# Patient Record
Sex: Male | Born: 1965 | Race: White | Hispanic: No | Marital: Married | Smoking: Never smoker
Health system: Southern US, Community
[De-identification: ages and names within clinical notes are randomized; demographics above are authoritative.]

## PROBLEM LIST (undated history)

## (undated) DIAGNOSIS — Z86718 Personal history of other venous thrombosis and embolism: Secondary | ICD-10-CM

## (undated) DIAGNOSIS — K219 Gastro-esophageal reflux disease without esophagitis: Secondary | ICD-10-CM

## (undated) DIAGNOSIS — Z87898 Personal history of other specified conditions: Secondary | ICD-10-CM

## (undated) DIAGNOSIS — M545 Low back pain, unspecified: Secondary | ICD-10-CM

## (undated) DIAGNOSIS — Z8781 Personal history of (healed) traumatic fracture: Secondary | ICD-10-CM

## (undated) DIAGNOSIS — K8020 Calculus of gallbladder without cholecystitis without obstruction: Secondary | ICD-10-CM

## (undated) DIAGNOSIS — R001 Bradycardia, unspecified: Secondary | ICD-10-CM

## (undated) DIAGNOSIS — M1990 Unspecified osteoarthritis, unspecified site: Secondary | ICD-10-CM

## (undated) HISTORY — PX: ORCHIOPEXY: SHX479

## (undated) HISTORY — DX: Personal history of other venous thrombosis and embolism: Z86.718

## (undated) HISTORY — DX: Personal history of other specified conditions: Z87.898

## (undated) HISTORY — DX: Low back pain: M54.5

## (undated) HISTORY — DX: Bradycardia, unspecified: R00.1

## (undated) HISTORY — DX: Calculus of gallbladder without cholecystitis without obstruction: K80.20

## (undated) HISTORY — PX: TONSILLECTOMY AND ADENOIDECTOMY: SUR1326

## (undated) HISTORY — DX: Personal history of (healed) traumatic fracture: Z87.81

## (undated) HISTORY — DX: Low back pain, unspecified: M54.50

---

## 2003-10-24 DIAGNOSIS — Z86718 Personal history of other venous thrombosis and embolism: Secondary | ICD-10-CM

## 2003-10-24 DIAGNOSIS — Z8781 Personal history of (healed) traumatic fracture: Secondary | ICD-10-CM

## 2003-10-24 HISTORY — DX: Personal history of (healed) traumatic fracture: Z87.81

## 2003-10-24 HISTORY — DX: Personal history of other venous thrombosis and embolism: Z86.718

## 2013-10-23 LAB — HM COLONOSCOPY

## 2014-03-04 ENCOUNTER — Other Ambulatory Visit: Payer: BC Managed Care – PPO

## 2014-03-04 ENCOUNTER — Other Ambulatory Visit (INDEPENDENT_AMBULATORY_CARE_PROVIDER_SITE_OTHER): Payer: BC Managed Care – PPO

## 2014-03-04 ENCOUNTER — Ambulatory Visit (INDEPENDENT_AMBULATORY_CARE_PROVIDER_SITE_OTHER): Payer: BC Managed Care – PPO | Admitting: Internal Medicine

## 2014-03-04 ENCOUNTER — Encounter: Payer: Self-pay | Admitting: Internal Medicine

## 2014-03-04 VITALS — BP 132/72 | Temp 97.2°F | Ht 74.0 in | Wt 174.0 lb

## 2014-03-04 DIAGNOSIS — R140 Abdominal distension (gaseous): Secondary | ICD-10-CM | POA: Insufficient documentation

## 2014-03-04 DIAGNOSIS — R141 Gas pain: Secondary | ICD-10-CM

## 2014-03-04 DIAGNOSIS — R142 Eructation: Secondary | ICD-10-CM

## 2014-03-04 DIAGNOSIS — R143 Flatulence: Secondary | ICD-10-CM

## 2014-03-04 DIAGNOSIS — M25549 Pain in joints of unspecified hand: Secondary | ICD-10-CM

## 2014-03-04 LAB — CBC WITH DIFFERENTIAL/PLATELET
Basophils Absolute: 0 10*3/uL (ref 0.0–0.1)
Basophils Relative: 0.3 % (ref 0.0–3.0)
Eosinophils Absolute: 0.1 10*3/uL (ref 0.0–0.7)
Eosinophils Relative: 2 % (ref 0.0–5.0)
HCT: 41.1 % (ref 39.0–52.0)
Hemoglobin: 14.1 g/dL (ref 13.0–17.0)
Lymphocytes Relative: 25.1 % (ref 12.0–46.0)
Lymphs Abs: 1.3 10*3/uL (ref 0.7–4.0)
MCHC: 34.2 g/dL (ref 30.0–36.0)
MCV: 94.2 fl (ref 78.0–100.0)
Monocytes Absolute: 0.4 10*3/uL (ref 0.1–1.0)
Monocytes Relative: 7.6 % (ref 3.0–12.0)
NEUTROS PCT: 65 % (ref 43.0–77.0)
Neutro Abs: 3.4 10*3/uL (ref 1.4–7.7)
PLATELETS: 293 10*3/uL (ref 150.0–400.0)
RBC: 4.36 Mil/uL (ref 4.22–5.81)
RDW: 13.5 % (ref 11.5–15.5)
WBC: 5.2 10*3/uL (ref 4.0–10.5)

## 2014-03-04 LAB — BASIC METABOLIC PANEL
BUN: 10 mg/dL (ref 6–23)
CO2: 31 mEq/L (ref 19–32)
Calcium: 9.3 mg/dL (ref 8.4–10.5)
Chloride: 102 mEq/L (ref 96–112)
Creatinine, Ser: 0.8 mg/dL (ref 0.4–1.5)
GFR: 116.23 mL/min (ref >60.00)
Glucose, Bld: 74 mg/dL (ref 70–99)
POTASSIUM: 3.7 meq/L (ref 3.5–5.1)
SODIUM: 139 meq/L (ref 135–145)

## 2014-03-04 LAB — SEDIMENTATION RATE: Sed Rate: 6 mm/hr (ref 0–22)

## 2014-03-04 LAB — HEPATIC FUNCTION PANEL
ALT: 18 U/L (ref 0–53)
AST: 20 U/L (ref 0–37)
Albumin: 4.6 g/dL (ref 3.5–5.2)
Alkaline Phosphatase: 54 U/L (ref 39–117)
BILIRUBIN TOTAL: 0.9 mg/dL (ref 0.2–1.2)
Bilirubin, Direct: 0.1 mg/dL (ref 0.0–0.3)
Total Protein: 6.9 g/dL (ref 6.0–8.3)

## 2014-03-04 LAB — VITAMIN B12: VITAMIN B 12: 136 pg/mL — AB (ref 211–911)

## 2014-03-04 LAB — TSH: TSH: 1.65 u[IU]/mL (ref 0.35–4.50)

## 2014-03-04 MED ORDER — METRONIDAZOLE 500 MG PO TABS: 500.0000 mg | ORAL_TABLET | Freq: Three times a day (TID) | ORAL | Status: DC

## 2014-03-04 MED ORDER — SACCHAROMYCES BOULARDII 250 MG PO CAPS: 250.0000 mg | ORAL_CAPSULE | Freq: Two times a day (BID) | ORAL | Status: DC

## 2014-03-04 NOTE — Patient Instructions (Signed)
Gluten free trial (no wheat products) for 4-6 weeks. OK to use gluten-free bread and gluten-free pasta.  Milk free trial (no milk, ice cream, cheese and yogurt) for 4-6 weeks. OK to use almond, coconut, rice milk. "Almond breeze" brand tastes good.  

## 2014-03-04 NOTE — Progress Notes (Signed)
   Subjective:    HPI  New pt  C/o abd gas and bloating since Christmas 2014. Joshua Walton had a GI w/up in [place]. His H pylori was (-). No n/v. No wt loss, diarrhea. He had a colonoscopy  Had a constipation problem prior He has a remote h/o bradycardia/syncope - none in the past few years  C/o hands OA       Review of Systems  Constitutional: Negative for appetite change, fatigue and unexpected weight change.  HENT: Negative for congestion, dental problem, ear pain, nosebleeds, sneezing, sore throat and trouble swallowing.   Eyes: Negative for itching and visual disturbance.  Respiratory: Negative for cough.   Cardiovascular: Negative for chest pain, palpitations and leg swelling.  Gastrointestinal: Positive for constipation. Negative for nausea, vomiting, abdominal pain, diarrhea, blood in stool, abdominal distention and rectal pain.  Genitourinary: Negative for frequency and hematuria.  Musculoskeletal: Negative for back pain, gait problem, joint swelling and neck pain.  Skin: Negative for rash.  Neurological: Negative for dizziness, tremors, speech difficulty and weakness.  Psychiatric/Behavioral: Negative for suicidal ideas, sleep disturbance, dysphoric mood, decreased concentration and agitation. The patient is not nervous/anxious.        Objective:   Physical Exam  Constitutional: He is oriented to person, place, and time. He appears well-developed. No distress.  NAD  HENT:  Mouth/Throat: Oropharynx is clear and moist.  Eyes: Conjunctivae are normal. Pupils are equal, round, and reactive to light.  Neck: Normal range of motion. No JVD present. No thyromegaly present.  Cardiovascular: Normal rate, regular rhythm, normal heart sounds and intact distal pulses.  Exam reveals no gallop and no friction rub.   No murmur heard. Pulmonary/Chest: Effort normal and breath sounds normal. No respiratory distress. He has no wheezes. He has no rales. He exhibits no tenderness.    Abdominal: Soft. Bowel sounds are normal. He exhibits no distension and no mass. There is no tenderness. There is no rebound and no guarding.  Musculoskeletal: Normal range of motion. He exhibits no edema and no tenderness.  Lymphadenopathy:    He has no cervical adenopathy.  Neurological: He is alert and oriented to person, place, and time. He has normal reflexes. No cranial nerve deficit. He exhibits normal muscle tone. He displays a negative Romberg sign. Coordination and gait normal.  No meningeal signs  Skin: Skin is warm and dry. No rash noted.  Psychiatric: He has a normal mood and affect. His behavior is normal. Judgment and thought content normal.     .     Assessment & Plan:

## 2014-03-04 NOTE — Progress Notes (Signed)
Pre visit review using our clinic review tool, if applicable. No additional management support is needed unless otherwise documented below in the visit note. 

## 2014-03-05 LAB — ALLERGEN FOOD PROFILE SPECIFIC IGE
Apple: <0.1 kU/L
Chicken IgE: <0.1 kU/L
Corn: <0.1 kU/L
IGE (IMMUNOGLOBULIN E), SERUM: 14.1 [IU]/mL (ref 0.0–180.0)
Milk IgE: <0.1 kU/L
Orange: <0.1 kU/L
Peanut IgE: <0.1 kU/L
Soybean IgE: <0.1 kU/L
Tuna IgE: <0.1 kU/L

## 2014-03-06 ENCOUNTER — Telehealth: Payer: Self-pay | Admitting: Internal Medicine

## 2014-03-06 NOTE — Telephone Encounter (Signed)
LVMM for pt call us back and make an OV appt for next week to start the B12 injections.

## 2014-03-06 NOTE — Telephone Encounter (Signed)
Message copied by Lowella Dandy on Fri Mar 06, 2014 12:03 PM ------      Message from: Cassandria Anger      Created: Fri Mar 06, 2014  7:23 AM       Erline Levine, please, inform patient that all labs are normal except for a low vit B12: vit B12 deficiency. Pls see me for an OV next week to start B12 shots      Thx       ------

## 2014-03-07 DIAGNOSIS — M25549 Pain in joints of unspecified hand: Secondary | ICD-10-CM | POA: Insufficient documentation

## 2014-03-07 MED ORDER — VITAMIN D 1000 UNITS PO TABS: 1000.0000 [IU] | ORAL_TABLET | Freq: Every day | ORAL | Status: AC

## 2014-03-07 NOTE — Assessment & Plan Note (Signed)
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 OK to use gluten-free bread and gluten-free pasta. Milk free trial (no milk, ice cream, cheese and yogurt) for 4-6 weeks. OK to use almond, coconut, rice or soy milk. "Almond breeze" brand tastes good. Labs Empiric Flagyl followed by a probiotic x 1 mo

## 2014-03-07 NOTE — Assessment & Plan Note (Signed)
NSAIDs prn

## 2014-03-09 LAB — IGG FOOD PANEL
ALLERGEN BEEF IGG: 4.7 ug/mL — AB (ref <2.0)
Allergen, Milk, IgG: 12.4 ug/mL — ABNORMAL HIGH (ref <0.15)
Chicken, IgG: <0.15 ug/mL (ref <0.15)
Corn, IgG: <0.15 ug/mL (ref <0.15)
Egg white, IgG: <2 ug/mL (ref <2.0)
Peanut, IgG: <0.15 ug/mL (ref <0.15)

## 2014-03-11 ENCOUNTER — Ambulatory Visit (INDEPENDENT_AMBULATORY_CARE_PROVIDER_SITE_OTHER): Payer: BC Managed Care – PPO | Admitting: Internal Medicine

## 2014-03-11 ENCOUNTER — Encounter: Payer: Self-pay | Admitting: Internal Medicine

## 2014-03-11 VITALS — BP 118/78 | HR 60 | Temp 97.3°F | Resp 16 | Wt 173.0 lb

## 2014-03-11 DIAGNOSIS — R141 Gas pain: Secondary | ICD-10-CM

## 2014-03-11 DIAGNOSIS — R142 Eructation: Secondary | ICD-10-CM

## 2014-03-11 DIAGNOSIS — E538 Deficiency of other specified B group vitamins: Secondary | ICD-10-CM

## 2014-03-11 DIAGNOSIS — R140 Abdominal distension (gaseous): Secondary | ICD-10-CM

## 2014-03-11 DIAGNOSIS — R143 Flatulence: Secondary | ICD-10-CM

## 2014-03-11 MED ORDER — VITAMIN B-12 1000 MCG SL SUBL: 1.0000 | SUBLINGUAL_TABLET | Freq: Every day | SUBLINGUAL | Status: AC

## 2014-03-11 MED ORDER — CYANOCOBALAMIN 1000 MCG/ML IJ SOLN
1000.0000 ug | Freq: Once | INTRAMUSCULAR | Status: AC
  Administered 2014-03-11: 1000 ug via INTRAMUSCULAR

## 2014-03-11 NOTE — Progress Notes (Signed)
   Subjective:    HPI    C/o abd gas and bloating since Christmas 2014. Joshua Walton had a GI w/up in [place]. His H pylori was (-). No n/v. No wt loss, diarrhea. He had a colonoscopy 3 mo ago  Had a constipation problem prior He has a remote h/o bradycardia/syncope - none in the past few years  F/u hands OA       Review of Systems  Constitutional: Negative for appetite change, fatigue and unexpected weight change.  HENT: Negative for congestion, dental problem, ear pain, nosebleeds, sneezing, sore throat and trouble swallowing.   Eyes: Negative for itching and visual disturbance.  Respiratory: Negative for cough.   Cardiovascular: Negative for chest pain, palpitations and leg swelling.  Gastrointestinal: Positive for constipation. Negative for nausea, vomiting, abdominal pain, diarrhea, blood in stool, abdominal distention and rectal pain.  Genitourinary: Negative for frequency and hematuria.  Musculoskeletal: Negative for back pain, gait problem, joint swelling and neck pain.  Skin: Negative for rash.  Neurological: Negative for dizziness, tremors, speech difficulty and weakness.  Psychiatric/Behavioral: Negative for suicidal ideas, sleep disturbance, dysphoric mood, decreased concentration and agitation. The patient is not nervous/anxious.        Objective:   Physical Exam  Constitutional: He is oriented to person, place, and time. He appears well-developed. No distress.  NAD  HENT:  Mouth/Throat: Oropharynx is clear and moist.  Eyes: Conjunctivae are normal. Pupils are equal, round, and reactive to light.  Neck: Normal range of motion. No JVD present. No thyromegaly present.  Cardiovascular: Normal rate, regular rhythm, normal heart sounds and intact distal pulses.  Exam reveals no gallop and no friction rub.   No murmur heard. Pulmonary/Chest: Effort normal and breath sounds normal. No respiratory distress. He has no wheezes. He has no rales. He exhibits no  tenderness.  Abdominal: Soft. Bowel sounds are normal. He exhibits no distension and no mass. There is no tenderness. There is no rebound and no guarding.  Musculoskeletal: Normal range of motion. He exhibits no edema and no tenderness.  Lymphadenopathy:    He has no cervical adenopathy.  Neurological: He is alert and oriented to person, place, and time. He has normal reflexes. No cranial nerve deficit. He exhibits normal muscle tone. He displays a negative Romberg sign. Coordination and gait normal.  No meningeal signs  Skin: Skin is warm and dry. No rash noted.  Psychiatric: He has a normal mood and affect. His behavior is normal. Judgment and thought content normal.    Lab Results  Component Value Date   WBC 5.2 03/04/2014   HGB 14.1 03/04/2014   HCT 41.1 03/04/2014   PLT 293.0 03/04/2014   GLUCOSE 74 03/04/2014   ALT 18 03/04/2014   AST 20 03/04/2014   NA 139 03/04/2014   K 3.7 03/04/2014   CL 102 03/04/2014   CREATININE 0.8 03/04/2014   BUN 10 03/04/2014   CO2 31 03/04/2014   TSH 1.65 03/04/2014    .     Assessment & Plan:

## 2014-03-11 NOTE — Progress Notes (Signed)
Pre visit review using our clinic review tool, if applicable. No additional management support is needed unless otherwise documented below in the visit note. 

## 2014-03-16 DIAGNOSIS — E538 Deficiency of other specified B group vitamins: Secondary | ICD-10-CM | POA: Insufficient documentation

## 2014-03-16 NOTE — Assessment & Plan Note (Addendum)
GI consult was suggested to r/o gastritis etc Hold off Flagyl x 1-2 mo

## 2014-03-16 NOTE — Assessment & Plan Note (Signed)
IM B12 inj given B12 def and available ways to treat it were discussed: he would like to take SL B12 Labs in 4-6 wks

## 2014-05-14 ENCOUNTER — Encounter: Payer: Self-pay | Admitting: Internal Medicine

## 2014-07-07 ENCOUNTER — Ambulatory Visit (INDEPENDENT_AMBULATORY_CARE_PROVIDER_SITE_OTHER): Payer: BC Managed Care – PPO | Admitting: Internal Medicine

## 2014-07-07 ENCOUNTER — Other Ambulatory Visit (INDEPENDENT_AMBULATORY_CARE_PROVIDER_SITE_OTHER): Payer: BC Managed Care – PPO

## 2014-07-07 ENCOUNTER — Encounter: Payer: Self-pay | Admitting: Internal Medicine

## 2014-07-07 VITALS — BP 140/88 | HR 64 | Temp 97.7°F | Resp 16 | Wt 171.0 lb

## 2014-07-07 DIAGNOSIS — R143 Flatulence: Secondary | ICD-10-CM

## 2014-07-07 DIAGNOSIS — R141 Gas pain: Secondary | ICD-10-CM

## 2014-07-07 DIAGNOSIS — R140 Abdominal distension (gaseous): Secondary | ICD-10-CM

## 2014-07-07 DIAGNOSIS — Z23 Encounter for immunization: Secondary | ICD-10-CM

## 2014-07-07 DIAGNOSIS — E538 Deficiency of other specified B group vitamins: Secondary | ICD-10-CM

## 2014-07-07 DIAGNOSIS — R142 Eructation: Secondary | ICD-10-CM

## 2014-07-07 DIAGNOSIS — K8020 Calculus of gallbladder without cholecystitis without obstruction: Secondary | ICD-10-CM

## 2014-07-07 LAB — VITAMIN B12: Vitamin B-12: 559 pg/mL (ref 211–911)

## 2014-07-07 LAB — HEPATIC FUNCTION PANEL
ALT: 16 U/L (ref 0–53)
AST: 23 U/L (ref 0–37)
Albumin: 4.2 g/dL (ref 3.5–5.2)
Alkaline Phosphatase: 54 U/L (ref 39–117)
BILIRUBIN DIRECT: 0.1 mg/dL (ref 0.0–0.3)
BILIRUBIN TOTAL: 1 mg/dL (ref 0.2–1.2)
Total Protein: 6.8 g/dL (ref 6.0–8.3)

## 2014-07-07 NOTE — Progress Notes (Signed)
Pre visit review using our clinic review tool, if applicable. No additional management support is needed unless otherwise documented below in the visit note. 

## 2014-07-07 NOTE — Patient Instructions (Signed)
Try Zenpep 1 with meals

## 2014-07-07 NOTE — Assessment & Plan Note (Signed)
Repeat US GI ref was offered

## 2014-07-07 NOTE — Assessment & Plan Note (Signed)
Continue with current prescription therapy as reflected on the Med list. Labs  GI ref was offered - needs EGD

## 2014-07-07 NOTE — Progress Notes (Signed)
   Subjective:    HPI    F/u abd gas and bloating since Christmas 2014. Joshua Walton had a GI w/up in [place]. His H pylori was (-). No n/v. No wt loss, diarrhea. He had a colonoscopy in 2014 - nl. Joshua Walton tells me now that he had gallstones diagnosed 10 years ago (no surgery)  He had a constipation problem prior He has a remote h/o bradycardia/syncope - none in the past few years  F/u hands OA  Wt Readings from Last 3 Encounters:  07/07/14 171 lb (77.565 kg)  03/11/14 173 lb (78.472 kg)  03/04/14 174 lb (78.926 kg)    BP Readings from Last 3 Encounters:  07/07/14 140/88  03/11/14 118/78  03/04/14 132/72         Review of Systems  Constitutional: Negative for appetite change, fatigue and unexpected weight change.  HENT: Negative for congestion, dental problem, ear pain, nosebleeds, sneezing, sore throat and trouble swallowing.   Eyes: Negative for itching and visual disturbance.  Respiratory: Negative for cough.   Cardiovascular: Negative for chest pain, palpitations and leg swelling.  Gastrointestinal: Positive for constipation. Negative for nausea, vomiting, abdominal pain, diarrhea, blood in stool, abdominal distention and rectal pain.  Genitourinary: Negative for frequency and hematuria.  Musculoskeletal: Negative for back pain, gait problem, joint swelling and neck pain.  Skin: Negative for rash.  Neurological: Negative for dizziness, tremors, speech difficulty and weakness.  Psychiatric/Behavioral: Negative for suicidal ideas, sleep disturbance, dysphoric mood, decreased concentration and agitation. The patient is not nervous/anxious.        Objective:   Physical Exam  Constitutional: He is oriented to person, place, and time. He appears well-developed. No distress.  NAD  HENT:  Mouth/Throat: Oropharynx is clear and moist.  Eyes: Conjunctivae are normal. Pupils are equal, round, and reactive to light.  Neck: Normal range of motion. No JVD present. No  thyromegaly present.  Cardiovascular: Normal rate, regular rhythm, normal heart sounds and intact distal pulses.  Exam reveals no gallop and no friction rub.   No murmur heard. Pulmonary/Chest: Effort normal and breath sounds normal. No respiratory distress. He has no wheezes. He has no rales. He exhibits no tenderness.  Abdominal: Soft. Bowel sounds are normal. He exhibits no distension and no mass. There is no tenderness. There is no rebound and no guarding.  Musculoskeletal: Normal range of motion. He exhibits no edema and no tenderness.  Lymphadenopathy:    He has no cervical adenopathy.  Neurological: He is alert and oriented to person, place, and time. He has normal reflexes. No cranial nerve deficit. He exhibits normal muscle tone. He displays a negative Romberg sign. Coordination and gait normal.  No meningeal signs  Skin: Skin is warm and dry. No rash noted.  Psychiatric: He has a normal mood and affect. His behavior is normal. Judgment and thought content normal.     Lab Results  Component Value Date   WBC 5.2 03/04/2014   HGB 14.1 03/04/2014   HCT 41.1 03/04/2014   PLT 293.0 03/04/2014   GLUCOSE 74 03/04/2014   ALT 18 03/04/2014   AST 20 03/04/2014   NA 139 03/04/2014   K 3.7 03/04/2014   CL 102 03/04/2014   CREATININE 0.8 03/04/2014   BUN 10 03/04/2014   CO2 31 03/04/2014   TSH 1.65 03/04/2014        Assessment & Plan:

## 2014-07-09 ENCOUNTER — Encounter: Payer: Self-pay | Admitting: Gastroenterology

## 2014-07-17 ENCOUNTER — Ambulatory Visit
Admission: RE | Admit: 2014-07-17 | Discharge: 2014-07-17 | Disposition: A | Discharge status: Home / Self Care | Payer: BC Managed Care – PPO | Source: Ambulatory Visit | Attending: Internal Medicine | Admitting: Internal Medicine

## 2014-07-19 ENCOUNTER — Other Ambulatory Visit: Payer: Self-pay | Admitting: Internal Medicine

## 2014-07-19 DIAGNOSIS — K8020 Calculus of gallbladder without cholecystitis without obstruction: Secondary | ICD-10-CM

## 2014-08-14 ENCOUNTER — Encounter (HOSPITAL_COMMUNITY): Payer: Self-pay | Admitting: Pharmacy Technician

## 2014-08-21 ENCOUNTER — Other Ambulatory Visit (INDEPENDENT_AMBULATORY_CARE_PROVIDER_SITE_OTHER): Payer: Self-pay | Admitting: General Surgery

## 2014-08-24 NOTE — Pre-Procedure Instructions (Addendum)
Joshua Walton  08/24/2014   Your procedure is scheduled on:  Wednesday, November 11.  Report to [facility] Admitting at 8:10 AM.  Call this number if you have problems the morning of surgery: [phone]   Remember:   Do not eat food or drink liquids after midnight Tuesday, November 10.   Take these medicines the morning of surgery with A SIP OF WATER: None.              Stop taking Vitamins.STOP all herbel meds, nsaids (aleve,naproxen,advil,ibuprofen) 5 days prior to surgery starting 08/28/14   Do not wear jewelry, make-up or nail polish.  Do not wear lotions, powders, or perfumes.   Men may shave face and neck.  Do not bring valuables to the hospital.              [facility] is not responsible for any belongings or valuables.               Contacts, dentures or bridgework may not be worn into surgery.  Leave suitcase in the car. After surgery it may be brought to your room.  For patients admitted to the hospital, discharge time is determined by your treatment team.               Patients discharged the day of surgery will not be allowed to drive home.  Name and phone number of your driver: -   Special Instructions: - Special Instructions: [facility] - Preparing for Surgery  Before surgery, you can play an important role.  Because skin is not sterile, your skin needs to be as free of germs as possible.  You can reduce the number of germs on you skin by washing with CHG (chlorahexidine gluconate) soap before surgery.  CHG is an antiseptic cleaner which kills germs and bonds with the skin to continue killing germs even after washing.  Please DO NOT use if you have an allergy to CHG or antibacterial soaps.  If your skin becomes reddened/irritated stop using the CHG and inform your nurse when you arrive at Short Stay.  Do not shave (including legs and underarms) for at least 48 hours prior to the first CHG shower.  You may shave your face.  Please follow these  instructions carefully:   1.  Shower with CHG Soap the night before surgery and the morning of Surgery.  2.  If you choose to wash your hair, wash your hair first as usual with your normal shampoo.  3.  After you shampoo, rinse your hair and body thoroughly to remove the Shampoo.  4.  Use CHG as you would any other liquid soap.  You can apply chg directly  to the skin and wash gently with scrungie or a clean washcloth.  5.  Apply the CHG Soap to your body ONLY FROM THE NECK DOWN.  Do not use on open wounds or open sores.  Avoid contact with your eyes ears, mouth and genitals (private parts).  Wash genitals (private parts)       with your normal soap.  6.  Wash thoroughly, paying special attention to the area where your surgery will be performed.  7.  Thoroughly rinse your body with warm water from the neck down.  8.  DO NOT shower/wash with your normal soap after using and rinsing off the CHG Soap.  9.  Pat yourself dry with a clean towel.            10.  Wear clean pajamas.            11.  Place clean sheets on your bed the night of your first shower and do not sleep with pets.  Day of Surgery  Do not apply any lotions/deodorants the morning of surgery.  Please wear clean clothes to the hospital/surgery center.   Please read over the following fact sheets that you were given: Pain Booklet, Coughing and Deep Breathing and Surgical Site Infection Prevention

## 2014-08-25 ENCOUNTER — Encounter (HOSPITAL_COMMUNITY)
Admission: RE | Admit: 2014-08-25 | Discharge: 2014-08-25 | Disposition: A | Discharge status: Home / Self Care | Payer: BC Managed Care – PPO | Source: Ambulatory Visit | Attending: General Surgery | Admitting: General Surgery

## 2014-08-25 ENCOUNTER — Encounter (HOSPITAL_COMMUNITY): Payer: Self-pay

## 2014-08-25 DIAGNOSIS — Z01818 Encounter for other preprocedural examination: Secondary | ICD-10-CM | POA: Diagnosis not present

## 2014-08-25 DIAGNOSIS — Z86718 Personal history of other venous thrombosis and embolism: Secondary | ICD-10-CM | POA: Insufficient documentation

## 2014-08-25 DIAGNOSIS — M1990 Unspecified osteoarthritis, unspecified site: Secondary | ICD-10-CM | POA: Diagnosis not present

## 2014-08-25 DIAGNOSIS — K219 Gastro-esophageal reflux disease without esophagitis: Secondary | ICD-10-CM | POA: Insufficient documentation

## 2014-08-25 DIAGNOSIS — R001 Bradycardia, unspecified: Secondary | ICD-10-CM | POA: Diagnosis not present

## 2014-08-25 DIAGNOSIS — R55 Syncope and collapse: Secondary | ICD-10-CM | POA: Insufficient documentation

## 2014-08-25 DIAGNOSIS — I517 Cardiomegaly: Secondary | ICD-10-CM | POA: Insufficient documentation

## 2014-08-25 DIAGNOSIS — Z8781 Personal history of (healed) traumatic fracture: Secondary | ICD-10-CM | POA: Diagnosis not present

## 2014-08-25 DIAGNOSIS — Z6822 Body mass index (BMI) 22.0-22.9, adult: Secondary | ICD-10-CM | POA: Diagnosis not present

## 2014-08-25 HISTORY — DX: Unspecified osteoarthritis, unspecified site: M19.90

## 2014-08-25 HISTORY — DX: Gastro-esophageal reflux disease without esophagitis: K21.9

## 2014-08-25 LAB — BASIC METABOLIC PANEL
Anion gap: 12 (ref 5–15)
BUN: 9 mg/dL (ref 6–23)
CHLORIDE: 104 meq/L (ref 96–112)
CO2: 27 meq/L (ref 19–32)
Calcium: 9.2 mg/dL (ref 8.4–10.5)
Creatinine, Ser: 0.66 mg/dL (ref 0.50–1.35)
GFR calc non Af Amer: >90 mL/min (ref >90)
Glucose, Bld: 96 mg/dL (ref 70–99)
POTASSIUM: 3.6 meq/L — AB (ref 3.7–5.3)
Sodium: 143 mEq/L (ref 137–147)

## 2014-08-25 LAB — CBC
HEMATOCRIT: 39.5 % (ref 39.0–52.0)
Hemoglobin: 13.4 g/dL (ref 13.0–17.0)
MCH: 30.9 pg (ref 26.0–34.0)
MCHC: 33.9 g/dL (ref 30.0–36.0)
MCV: 91.2 fL (ref 78.0–100.0)
Platelets: 261 10*3/uL (ref 150–400)
RBC: 4.33 MIL/uL (ref 4.22–5.81)
RDW: 13 % (ref 11.5–15.5)
WBC: 4.5 10*3/uL (ref 4.0–10.5)

## 2014-08-25 NOTE — Progress Notes (Addendum)
Anesthesia Chart Review:  Patient is a 48 year old male scheduled for laparoscopic cholecystectomy on 09/02/14 by Dr. Redmond Pulling.  He moved to [place] from [place] ~ 9 months ago.    History includes non-smoker, DVT following leg fracture '05, GERD, arthritis, bradycardia.  He reported a history of syncope/presyncope dating back to ~ 2002.  He was living in [place] then and reportedly saw a cardiologist and had a normal treadmill stress test, echocardiogram, and 2 week event monitor (although he did not experience any episodes during that two weeks).  He said he did have a positive tilt test and was initially prescribed a b-blocker which just made him feel worse so it was stopped.  He also saw a neurologist with a normal MRI of the brain.  He was never given a clear explanation for his symptoms.  He denies history of rheumatic fever, seizures, ETOH/illicit drug use, CVA, CAD/CHF. His uncle has a recent history of syncope, but that occurred at ~ age 85 in the setting of what sounds like afib onset.  He has not seen a cardiologist or neurologist is > 10 years.  His current PCP is Dr. Alain Marion.  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  He bike to work without CV symptoms (distance of 7 miles).  He has not had an actual syncope episode in years.  He is aware when he is starting to feel pre-syncope coming on and is able to put his head down and recover within 1-2 minutes.  He considers these episodes rare, but last one happened 2-3 days ago after returning for a flight from [place].  Episodes can occur with increased fatigue or post-prandial. There is no associated chest pain, palpitations, SOB. His HR is known to be as low as the 40's.    Meds: Vitamin B-12, Tylenol, Vitamin D.  EKG 08/25/14: SB at 50 bpm, LV[facility].  Preoperative labs are unremarkable.  On exam, he is 6\' 2" , BMI 22.65.  Heart RRR, no murmur noted. Lungs clear.  No carotid bruits noted. No ankle edema noted.     I reviewed above with anesthesiologist Dr.  Tamala Julian. Due to persistent presyncope episodes and no recent cardiology follow-up, Dr. Tamala Julian recommended he be re-evaluated by a cardiologist preoperatively.  I have notified triage nurse at [facility] and the patient. [facility] nurse reported that she will contact Dr. Judeen Hammans office and [facility] or his PCP office will contact patient with additional information about cardiology appointment scheduling.    George Hugh [facility] Short Stay Center/Anesthesiology Phone [phone] 08/25/2014 12:47 PM  Addendum:  Records from [facility] received.  He saw cardiologist Dr. Jayme Cloud on 08/09/12.  - He had a normal stress echo on 08/11/02.   - Holter monitor read in 08/2002 showed: The predominant rhythm is sinus.  Average rate of 57 bpm, range 41 - 106 bpm. Rare (99, 0.2% of total beats) PVCs. Two PACs. No symptoms reported.  Patient currently has an appointment with cardiologist Dr. Domenic Polite on 08/27/14 for preoperative evaluation.  I'll forward this note for his review.  George Hugh [facility] Short Stay Center/Anesthesiology Phone [phone] 08/25/2014 6:32 PM  Addendum: Patient was seen by cardiologist Dr. Domenic Polite today for preoperative evaluation.  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 His bradycardia was not felt to be clearly symptoms provoking.  No further cardiac testing was recommended at this time, but follow-up if symptoms escalate recommended.  In regards to his upcoming surgery, Dr. Domenic Polite  did discuss the possibility of worsening bradycardia perioperatively.  Telemetry throughout was recommended with management with fluids or even atropine if needed.    George Hugh [facility] Short Stay Center/Anesthesiology Phone [phone] 08/27/2014 1:53 PM

## 2014-08-25 NOTE — Progress Notes (Addendum)
Patient has had episodes with bradycardia, syncope in the past. Tests done years ago in [place]. No cardic dr now. Last syncope episode ? Sunday 08/23/14. [facility] [facility] [place]. Requested notes, echo stress from 02 from [facility]-dr zinaida levin [phone] and also [facility] in [place] [phone]

## 2014-08-27 ENCOUNTER — Ambulatory Visit (INDEPENDENT_AMBULATORY_CARE_PROVIDER_SITE_OTHER): Payer: BC Managed Care – PPO | Admitting: Cardiology

## 2014-08-27 ENCOUNTER — Encounter: Payer: Self-pay | Admitting: Cardiology

## 2014-08-27 VITALS — BP 159/95 | HR 49 | Ht 74.0 in | Wt 173.0 lb

## 2014-08-27 DIAGNOSIS — Z01810 Encounter for preprocedural cardiovascular examination: Secondary | ICD-10-CM | POA: Insufficient documentation

## 2014-08-27 DIAGNOSIS — R55 Syncope and collapse: Secondary | ICD-10-CM | POA: Insufficient documentation

## 2014-08-27 NOTE — Patient Instructions (Signed)
Your physician recommends that you schedule a follow-up appointment in: as needed Your physician recommends that you continue on your current medications as directed. Please refer to the Current Medication list given to you today.   

## 2014-08-27 NOTE — Progress Notes (Signed)
Reason for visit: Preoperative evaluation, history of syncope  Clinical Summary Joshua Walton is a 48 y.o.male referred for cardiology consultation by Dr. Greer Pickerel with [facility]. He is being considered for a laparoscopic cholecystectomy, scheduled on November 11.  He has a history of syncope and near syncope dating back to 2002. He has undergone previous evaluations when living in [place] and also [place]. He has had a normal treadmill test as well as echocardiogram and subsequent 2 week event recorder in the past. He underwent a tilt table test which was reportedly positive with provocative maneuvers, and was placed on beta blocker, however this was not continued as he felt worse on the medication. Also seems that he has been evaluated by neurologist and had reportedly a normal MRI of the brain. In addition he has had a relatively slow heart rate over the last several years, not unusual for his heart rate to be in the 40s. He is not symptomatic with this specifically however.  He reports not having an actual episode of frank syncope for several years. Just recently he had a feeling of near syncope after having a very long day, was tired and felt bad with headache, after traveling from [place] back to [place]. He was dizzy but states that he did not actually pass out.  Patient he is a Joshua Walton, works as an Joshua Walton. He grew up in [place]. He rides his bicycle to work approximately 7 miles each way without angina or significant shortness of breath. He states that he averages between 10-15 miles per hour with saddlebags on his bike. He has never had syncope induced by exertion. Also denies syncope while wearing a collared shirt.  ECG from November 3 showed sinus bradycardia with increased voltage and repolarization abnormalities.   Allergies  Allergen Reactions  . Penicillins Shortness Of Breath    Current Outpatient Prescriptions  Medication Sig Dispense Refill  .  acetaminophen (TYLENOL) 325 MG tablet Take 650 mg by mouth every 6 (six) hours as needed.    . cholecalciferol (VITAMIN D) 1000 UNITS tablet Take 1 tablet (1,000 Units total) by mouth daily. 100 tablet 3  . Cyanocobalamin (VITAMIN B-12) 1000 MCG SUBL Place 1 tablet (1,000 mcg total) under the tongue daily. 100 tablet 3   No current facility-administered medications for this visit.    Past Medical History  Diagnosis Date  . LBP (low back pain)   . History of syncope   . Cholelithiasis   . History of fracture of leg 2005  . GERD (gastroesophageal reflux disease)   . Arthritis   . History of DVT (deep vein thrombosis) 2005    Associated with leg fracture    Past Surgical History  Procedure Laterality Date  . Tonsillectomy and adenoidectomy    . Orchiopexy      Child    Family History  Problem Relation Age of Onset  . Colon cancer Father 46  . Colon cancer Paternal Grandmother 84    Social History Mr. Hannen reports that he has never smoked. He does not have any smokeless tobacco history on file. Mr. Ambrosino reports that he does not drink alcohol.  Review of Systems Complete review of systems negative except as otherwise outlined in the clinical summary and also the following. No orthopnea, PND, palpitations, exertional chest pain. No claudication.  Physical Examination Filed Vitals:   08/27/14 1301  BP: 159/95  Pulse: 49   Filed Weights   08/27/14 1301  Weight: 173 lb (78.472 kg)  Tall, thin bearded male, appears comfortable at rest. HEENT: Conjunctiva and lids normal, oropharynx clear. Neck: Supple, no elevated JVP or carotid bruits, no thyromegaly. Lungs: Clear to auscultation, nonlabored breathing at rest. Cardiac: Regular rate and rhythm, no S3, soft apical systolic murmur, no pericardial rub. Abdomen: Soft, nontender, bowel sounds present, no guarding or rebound. Extremities: No pitting edema, distal pulses 2+. Skin: Warm and dry. Musculoskeletal: No  kyphosis. Neuropsychiatric: Alert and oriented x3, affect grossly appropriate.   Problem List and Plan   Syncope Description of symptoms and prior workup very consistent with neurocardiogenic syncope. It sounds like he has a long-standing history of bradycardia at baseline, however this is not clearly symptom provoking. He was not able to tolerate beta blocker in the past, felt weak and tired. Recent ECG reviewed, he does have increased voltage, blood pressure elevated today, but no longer-standing history of hypertension based on available information. Does not sound like chronotropic incompetence is an issue for him, he is able to cycle as outlined above without limitation and has no exertional chest pain or shortness of breath. At this point, no further cardiac workup is anticipated. Certainly if his symptoms escalate, we can see him back and discuss other options for treatment. He is already familiar with basic maneuvers to help with symptoms, also maintaining hydrated status.  Preoperative cardiovascular examination Patient being considered for elective laparoscopic cholecystectomy, scheduled next week. As noted above, no further cardiac testing is anticipated at this time. 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 It is certainly possible that he may have worsening bradycardia around the time of surgery, considering insufflation of the abdomen or other vagal enhancing maneuvers. He should be on telemetry throughout, could likely be managed with fluids and possibly even atropine if needed.    Satira Sark, M.D., F.A.C.C.

## 2014-08-27 NOTE — Assessment & Plan Note (Signed)
Patient being considered for elective laparoscopic cholecystectomy, scheduled next week. As noted above, no further cardiac testing is anticipated at this time. 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 It is certainly possible that he may have worsening bradycardia around the time of surgery, considering insufflation of the abdomen or other vagal enhancing maneuvers. He should be on telemetry throughout, could likely be managed with fluids and possibly even atropine if needed.

## 2014-08-27 NOTE — Assessment & Plan Note (Signed)
Description of symptoms and prior workup very consistent with neurocardiogenic syncope. It sounds like he has a long-standing history of bradycardia at baseline, however this is not clearly symptom provoking. He was not able to tolerate beta blocker in the past, felt weak and tired. Recent ECG reviewed, he does have increased voltage, blood pressure elevated today, but no longer-standing history of hypertension based on available information. Does not sound like chronotropic incompetence is an issue for him, he is able to cycle as outlined above without limitation and has no exertional chest pain or shortness of breath. At this point, no further cardiac workup is anticipated. Certainly if his symptoms escalate, we can see him back and discuss other options for treatment. He is already familiar with basic maneuvers to help with symptoms, also maintaining hydrated status.

## 2014-09-01 MED ORDER — CIPROFLOXACIN IN D5W 400 MG/200ML IV SOLN
400.0000 mg | INTRAVENOUS | Status: AC
  Administered 2014-09-02: 400 mg via INTRAVENOUS
  Filled 2014-09-01: qty 200

## 2014-09-02 ENCOUNTER — Ambulatory Visit (HOSPITAL_COMMUNITY): Payer: BC Managed Care – PPO | Admitting: Certified Registered"

## 2014-09-02 ENCOUNTER — Ambulatory Visit (HOSPITAL_COMMUNITY)
Admission: RE | Admit: 2014-09-02 | Discharge: 2014-09-02 | Disposition: A | Discharge status: Home / Self Care | Payer: BC Managed Care – PPO | Source: Ambulatory Visit | Attending: General Surgery | Admitting: General Surgery

## 2014-09-02 ENCOUNTER — Encounter (HOSPITAL_COMMUNITY)
Admission: RE | Disposition: A | Discharge status: Home / Self Care | Payer: Self-pay | Source: Ambulatory Visit | Attending: General Surgery

## 2014-09-02 ENCOUNTER — Encounter (HOSPITAL_COMMUNITY): Payer: Self-pay | Admitting: Surgery

## 2014-09-02 ENCOUNTER — Ambulatory Visit (HOSPITAL_COMMUNITY): Payer: BC Managed Care – PPO

## 2014-09-02 ENCOUNTER — Ambulatory Visit (HOSPITAL_COMMUNITY): Payer: BC Managed Care – PPO | Admitting: Vascular Surgery

## 2014-09-02 DIAGNOSIS — E538 Deficiency of other specified B group vitamins: Secondary | ICD-10-CM | POA: Insufficient documentation

## 2014-09-02 DIAGNOSIS — G43909 Migraine, unspecified, not intractable, without status migrainosus: Secondary | ICD-10-CM | POA: Insufficient documentation

## 2014-09-02 DIAGNOSIS — K8010 Calculus of gallbladder with chronic cholecystitis without obstruction: Secondary | ICD-10-CM | POA: Insufficient documentation

## 2014-09-02 DIAGNOSIS — M1990 Unspecified osteoarthritis, unspecified site: Secondary | ICD-10-CM | POA: Diagnosis not present

## 2014-09-02 DIAGNOSIS — Z86010 Personal history of colonic polyps: Secondary | ICD-10-CM | POA: Diagnosis not present

## 2014-09-02 DIAGNOSIS — K219 Gastro-esophageal reflux disease without esophagitis: Secondary | ICD-10-CM | POA: Diagnosis not present

## 2014-09-02 DIAGNOSIS — K8020 Calculus of gallbladder without cholecystitis without obstruction: Secondary | ICD-10-CM

## 2014-09-02 HISTORY — PX: CHOLECYSTECTOMY: SHX55

## 2014-09-02 SURGERY — LAPAROSCOPIC CHOLECYSTECTOMY WITH INTRAOPERATIVE CHOLANGIOGRAM
Anesthesia: General | Site: Abdomen

## 2014-09-02 MED ORDER — 0.9 % SODIUM CHLORIDE (POUR BTL) OPTIME
TOPICAL | Status: DC | PRN
  Administered 2014-09-02: 1000 mL

## 2014-09-02 MED ORDER — ACETAMINOPHEN 325 MG PO TABS: 650.0000 mg | ORAL_TABLET | ORAL | Status: DC | PRN

## 2014-09-02 MED ORDER — KETOROLAC TROMETHAMINE 30 MG/ML IJ SOLN
INTRAMUSCULAR | Status: AC
  Administered 2014-09-02: 30 mg via INTRAVENOUS
  Filled 2014-09-02: qty 1

## 2014-09-02 MED ORDER — SODIUM CHLORIDE 0.9 % IJ SOLN: 3.0000 mL | INTRAMUSCULAR | Status: DC | PRN

## 2014-09-02 MED ORDER — SODIUM CHLORIDE 0.9 % IR SOLN
Status: DC | PRN
  Administered 2014-09-02: 1000 mL

## 2014-09-02 MED ORDER — OXYCODONE HCL 5 MG PO TABS
ORAL_TABLET | ORAL | Status: DC
Start: 2014-09-02 — End: 2014-09-02
  Filled 2014-09-02: qty 1

## 2014-09-02 MED ORDER — IOHEXOL 300 MG/ML  SOLN
INTRAMUSCULAR | Status: DC | PRN
  Administered 2014-09-02: 08:00:00

## 2014-09-02 MED ORDER — SODIUM CHLORIDE 0.9 % IV SOLN: 250.0000 mL | INTRAVENOUS | Status: DC | PRN

## 2014-09-02 MED ORDER — MIDAZOLAM HCL 5 MG/5ML IJ SOLN
INTRAMUSCULAR | Status: DC | PRN
  Administered 2014-09-02: 2 mg via INTRAVENOUS

## 2014-09-02 MED ORDER — VECURONIUM BROMIDE 10 MG IV SOLR
INTRAVENOUS | Status: DC | PRN
  Administered 2014-09-02: 2 mg via INTRAVENOUS
  Administered 2014-09-02 (×2): 1 mg via INTRAVENOUS

## 2014-09-02 MED ORDER — SODIUM CHLORIDE 0.9 % IV SOLN: INTRAVENOUS | Status: DC

## 2014-09-02 MED ORDER — FENTANYL CITRATE 0.05 MG/ML IJ SOLN
INTRAMUSCULAR | Status: DC | PRN
  Administered 2014-09-02 (×5): 50 ug via INTRAVENOUS

## 2014-09-02 MED ORDER — ACETAMINOPHEN 650 MG RE SUPP: 650.0000 mg | RECTAL | Status: DC | PRN

## 2014-09-02 MED ORDER — LIDOCAINE HCL (CARDIAC) 20 MG/ML IV SOLN
INTRAVENOUS | Status: DC | PRN
  Administered 2014-09-02: 40 mg via INTRAVENOUS

## 2014-09-02 MED ORDER — VECURONIUM BROMIDE 10 MG IV SOLR
INTRAVENOUS | Status: AC
  Filled 2014-09-02: qty 10

## 2014-09-02 MED ORDER — KETOROLAC TROMETHAMINE 30 MG/ML IJ SOLN
INTRAMUSCULAR | Status: DC | PRN
  Administered 2014-09-02: 30 mg via INTRAVENOUS

## 2014-09-02 MED ORDER — NEOSTIGMINE METHYLSULFATE 10 MG/10ML IV SOLN
INTRAVENOUS | Status: DC | PRN
  Administered 2014-09-02: 3 mg via INTRAVENOUS

## 2014-09-02 MED ORDER — OXYCODONE HCL 5 MG PO TABS
5.0000 mg | ORAL_TABLET | ORAL | Status: DC | PRN
  Administered 2014-09-02: 5 mg via ORAL
  Filled 2014-09-02: qty 2

## 2014-09-02 MED ORDER — MORPHINE SULFATE 2 MG/ML IJ SOLN: 1.0000 mg | INTRAMUSCULAR | Status: DC | PRN

## 2014-09-02 MED ORDER — ACETAMINOPHEN 325 MG PO TABS: 325.0000 mg | ORAL_TABLET | ORAL | Status: DC | PRN

## 2014-09-02 MED ORDER — HYDROMORPHONE HCL 1 MG/ML IJ SOLN
INTRAMUSCULAR | Status: AC
  Administered 2014-09-02: 0.25 mg
  Filled 2014-09-02: qty 1

## 2014-09-02 MED ORDER — GLYCOPYRROLATE 0.2 MG/ML IJ SOLN
INTRAMUSCULAR | Status: AC
  Filled 2014-09-02: qty 2

## 2014-09-02 MED ORDER — SUCCINYLCHOLINE CHLORIDE 20 MG/ML IJ SOLN
INTRAMUSCULAR | Status: AC
  Filled 2014-09-02: qty 1

## 2014-09-02 MED ORDER — HYDROMORPHONE HCL 1 MG/ML IJ SOLN: 0.2500 mg | INTRAMUSCULAR | Status: DC | PRN

## 2014-09-02 MED ORDER — SUCCINYLCHOLINE CHLORIDE 20 MG/ML IJ SOLN
INTRAMUSCULAR | Status: DC | PRN
  Administered 2014-09-02: 60 mg via INTRAVENOUS

## 2014-09-02 MED ORDER — SODIUM CHLORIDE 0.9 % IJ SOLN
INTRAMUSCULAR | Status: AC
  Filled 2014-09-02: qty 10

## 2014-09-02 MED ORDER — ACETAMINOPHEN 160 MG/5ML PO SOLN: 325.0000 mg | ORAL | Status: DC | PRN

## 2014-09-02 MED ORDER — KETOROLAC TROMETHAMINE 30 MG/ML IJ SOLN
30.0000 mg | Freq: Four times a day (QID) | INTRAMUSCULAR | Status: DC
  Administered 2014-09-02: 30 mg via INTRAVENOUS

## 2014-09-02 MED ORDER — LACTATED RINGERS IV SOLN
INTRAVENOUS | Status: DC | PRN
  Administered 2014-09-02 (×2): via INTRAVENOUS

## 2014-09-02 MED ORDER — GLYCOPYRROLATE 0.2 MG/ML IJ SOLN
INTRAMUSCULAR | Status: DC | PRN
  Administered 2014-09-02 (×2): 0.4 mg via INTRAVENOUS

## 2014-09-02 MED ORDER — BUPIVACAINE-EPINEPHRINE (PF) 0.25% -1:200000 IJ SOLN
INTRAMUSCULAR | Status: AC
  Filled 2014-09-02: qty 30

## 2014-09-02 MED ORDER — FENTANYL CITRATE 0.05 MG/ML IJ SOLN
INTRAMUSCULAR | Status: AC
  Filled 2014-09-02: qty 5

## 2014-09-02 MED ORDER — SODIUM CHLORIDE 0.9 % IJ SOLN
3.0000 mL | Freq: Two times a day (BID) | INTRAMUSCULAR | Status: DC
Start: 2014-09-02 — End: 2014-09-02

## 2014-09-02 MED ORDER — LIDOCAINE HCL (CARDIAC) 20 MG/ML IV SOLN
INTRAVENOUS | Status: AC
  Filled 2014-09-02: qty 5

## 2014-09-02 MED ORDER — OXYCODONE HCL 5 MG/5ML PO SOLN: 5.0000 mg | Freq: Once | ORAL | Status: DC | PRN

## 2014-09-02 MED ORDER — BUPIVACAINE-EPINEPHRINE 0.25% -1:200000 IJ SOLN
INTRAMUSCULAR | Status: DC | PRN
  Administered 2014-09-02: 30 mL

## 2014-09-02 MED ORDER — PROPOFOL 10 MG/ML IV BOLUS
INTRAVENOUS | Status: AC
  Filled 2014-09-02: qty 20

## 2014-09-02 MED ORDER — PROPOFOL 10 MG/ML IV BOLUS
INTRAVENOUS | Status: DC | PRN
  Administered 2014-09-02: 200 mg via INTRAVENOUS

## 2014-09-02 MED ORDER — OXYCODONE-ACETAMINOPHEN 5-325 MG PO TABS: 1.0000 | ORAL_TABLET | ORAL | Status: DC | PRN

## 2014-09-02 MED ORDER — OXYCODONE HCL 5 MG PO TABS: 5.0000 mg | ORAL_TABLET | Freq: Once | ORAL | Status: DC | PRN

## 2014-09-02 MED ORDER — MIDAZOLAM HCL 2 MG/2ML IJ SOLN
INTRAMUSCULAR | Status: AC
  Filled 2014-09-02: qty 2

## 2014-09-02 MED ORDER — ONDANSETRON HCL 4 MG/2ML IJ SOLN
INTRAMUSCULAR | Status: AC
  Filled 2014-09-02: qty 2

## 2014-09-02 MED ORDER — NEOSTIGMINE METHYLSULFATE 10 MG/10ML IV SOLN
INTRAVENOUS | Status: AC
  Filled 2014-09-02: qty 1

## 2014-09-02 MED ORDER — EPHEDRINE SULFATE 50 MG/ML IJ SOLN
INTRAMUSCULAR | Status: DC | PRN
  Administered 2014-09-02: 5 mg via INTRAVENOUS

## 2014-09-02 MED ORDER — ONDANSETRON HCL 4 MG/2ML IJ SOLN
INTRAMUSCULAR | Status: DC | PRN
  Administered 2014-09-02: 4 mg via INTRAVENOUS

## 2014-09-02 SURGICAL SUPPLY — 47 items
APPLIER CLIP 5 13 M/L LIGAMAX5 (MISCELLANEOUS) ×3
BANDAGE ADH SHEER 1  50/CT (GAUZE/BANDAGES/DRESSINGS) IMPLANT
BENZOIN TINCTURE PRP APPL 2/3 (GAUZE/BANDAGES/DRESSINGS) ×3 IMPLANT
BLADE SURG ROTATE 9660 (MISCELLANEOUS) IMPLANT
CHLORAPREP W/TINT 26ML (MISCELLANEOUS) ×3 IMPLANT
CLIP APPLIE 5 13 M/L LIGAMAX5 (MISCELLANEOUS) ×1 IMPLANT
CLOSURE WOUND 1/2 X4 (GAUZE/BANDAGES/DRESSINGS) ×1
COVER MAYO STAND STRL (DRAPES) ×3 IMPLANT
COVER SURGICAL LIGHT HANDLE (MISCELLANEOUS) ×3 IMPLANT
DRAPE C-ARM 42X72 X-RAY (DRAPES) ×3 IMPLANT
DRAPE LAPAROSCOPIC ABDOMINAL (DRAPES) ×3 IMPLANT
DRSG TEGADERM 4X4.75 (GAUZE/BANDAGES/DRESSINGS) ×3 IMPLANT
ELECT REM PT RETURN 9FT ADLT (ELECTROSURGICAL) ×3
ELECTRODE REM PT RTRN 9FT ADLT (ELECTROSURGICAL) ×1 IMPLANT
GAUZE SPONGE 2X2 8PLY STRL LF (GAUZE/BANDAGES/DRESSINGS) ×1 IMPLANT
GLOVE BIO SURGEON STRL SZ8 (GLOVE) ×3 IMPLANT
GLOVE BIOGEL M STRL SZ7.5 (GLOVE) ×3 IMPLANT
GLOVE BIOGEL PI IND STRL 7.5 (GLOVE) ×1 IMPLANT
GLOVE BIOGEL PI IND STRL 8 (GLOVE) ×2 IMPLANT
GLOVE BIOGEL PI INDICATOR 7.5 (GLOVE) ×2
GLOVE BIOGEL PI INDICATOR 8 (GLOVE) ×4
GLOVE ECLIPSE 7.5 STRL STRAW (GLOVE) ×3 IMPLANT
GOWN STRL REUS W/ TWL LRG LVL3 (GOWN DISPOSABLE) ×1 IMPLANT
GOWN STRL REUS W/ TWL XL LVL3 (GOWN DISPOSABLE) ×2 IMPLANT
GOWN STRL REUS W/TWL LRG LVL3 (GOWN DISPOSABLE) ×2
GOWN STRL REUS W/TWL XL LVL3 (GOWN DISPOSABLE) ×4
KIT BASIN OR (CUSTOM PROCEDURE TRAY) ×3 IMPLANT
KIT ROOM TURNOVER OR (KITS) ×3 IMPLANT
NS IRRIG 1000ML POUR BTL (IV SOLUTION) ×3 IMPLANT
PAD ARMBOARD 7.5X6 YLW CONV (MISCELLANEOUS) ×3 IMPLANT
POUCH RETRIEVAL ECOSAC 10 (ENDOMECHANICALS) ×1 IMPLANT
POUCH RETRIEVAL ECOSAC 10MM (ENDOMECHANICALS) ×2
POUCH SPECIMEN RETRIEVAL 10MM (ENDOMECHANICALS) ×3 IMPLANT
SCISSORS LAP 5X35 DISP (ENDOMECHANICALS) ×3 IMPLANT
SET CHOLANGIOGRAPH 5 50 .035 (SET/KITS/TRAYS/PACK) ×3 IMPLANT
SET IRRIG TUBING LAPAROSCOPIC (IRRIGATION / IRRIGATOR) ×3 IMPLANT
SLEEVE ENDOPATH XCEL 5M (ENDOMECHANICALS) ×6 IMPLANT
SPECIMEN JAR SMALL (MISCELLANEOUS) ×3 IMPLANT
SPONGE GAUZE 2X2 STER 10/PKG (GAUZE/BANDAGES/DRESSINGS) ×2
STRIP CLOSURE SKIN 1/2X4 (GAUZE/BANDAGES/DRESSINGS) ×2 IMPLANT
SUT MNCRL AB 4-0 PS2 18 (SUTURE) ×3 IMPLANT
SUT VICRYL 0 UR6 27IN ABS (SUTURE) ×6 IMPLANT
TOWEL OR 17X26 10 PK STRL BLUE (TOWEL DISPOSABLE) ×3 IMPLANT
TRAY LAPAROSCOPIC (CUSTOM PROCEDURE TRAY) ×3 IMPLANT
TROCAR XCEL BLUNT TIP 100MML (ENDOMECHANICALS) ×3 IMPLANT
TROCAR XCEL NON-BLD 5MMX100MML (ENDOMECHANICALS) ×3 IMPLANT
TUBING INSUFFLATION (TUBING) ×3 IMPLANT

## 2014-09-02 NOTE — Interval H&P Note (Signed)
History and Physical Interval Note:  09/02/2014 8:33 AM  Joshua Walton  has presented today for surgery, with the diagnosis of Cholelithiasis  The various methods of treatment have been discussed with the patient and family. After consideration of risks, benefits and other options for treatment, the patient has consented to  Procedure(s): LAPAROSCOPIC CHOLECYSTECTOMY WITH INTRAOPERATIVE CHOLANGIOGRAM (N/A) as a surgical intervention .  The patient's history has been reviewed, patient examined, no change in status, stable for surgery.  I have reviewed the patient's chart and labs.  Questions were answered to the patient's satisfaction.    Leighton Ruff. Redmond Pulling, MD, [place], Bariatric, & Minimally Invasive Surgery [facility], [place]   [facility]

## 2014-09-02 NOTE — Anesthesia Postprocedure Evaluation (Signed)
  Anesthesia Post-op Note  Patient: Joshua Walton  Procedure(s) Performed: Procedure(s): LAPAROSCOPIC CHOLECYSTECTOMY WITH INTRAOPERATIVE CHOLANGIOGRAM (N/A)  Patient Location: PACU  Anesthesia Type:General  Level of Consciousness: awake, alert  and oriented  Airway and Oxygen Therapy: Patient Spontanous Breathing  Post-op Pain: none  Post-op Assessment: Post-op Vital signs reviewed  Post-op Vital Signs: Reviewed  Last Vitals:  Filed Vitals:   09/02/14 1145  BP: 140/87  Pulse: 54  Temp:   Resp: 16    Complications: No apparent anesthesia complications

## 2014-09-02 NOTE — H&P (Signed)
Joshua Walton 08/06/2014 10:30 AM Location: [facility] Patient #: [id number] DOB: 12-Jul-1966 Married / Language: Cleophus Molt / Race: White Male  History of Present Illness Randall Hiss M. Cyndia Degraff MD; 08/10/2014 3:38 PM) Patient words: new patient possible gallstones.  The patient is a 48 year old male who presents for evaluation of gall stones. 48 yo WM referred by Dr Alain Marion for evaluation of gallstones. He reports his first symptoms occurred about 10 yrs ago with RUQ pain, nausea, vomiting with a severe episode. He states he really didn't have any additional episodes until this past Christmas. He has been having more frequent episodes of right sided pain along with nausea. he also reports bloating and diarrhea. He states the pain will radiate to his right upper back. He saw a GI physician in February who changed his diet and eliminated gluten and lactose without any change in his symptoms. The discomours.mainly occurs at night and last for several hours. He states that discomfort will also occud use. He denies anyrea. He denies any NSAID use. He denies any melena. He'll occasionally have spots of bright red blood on toilet paper. he also eight loss. He denies any jaundice.e denies any weight loss. He denies any jaundice. He underwent an ultrasound on September 25th Which demonstrated numerous gallstones and normal common bile duct. He had normal LFTs on September 15. I reviewed his primary care physician note from September as well   Other Problems Briant Cedar, [place]; 08/06/2014 10:22 AM) Arthritis Back Pain Cholelithiasis Gastroesophageal Reflux Disease Migraine Headache  Past Surgical History Briant Cedar, [place]; 08/06/2014 10:22 AM) Colon Polyp Removal - Colonoscopy Tonsillectomy  Diagnostic Studies History Briant Cedar, [place]; 08/06/2014 10:22 AM) Colonoscopy within last year  Allergies Briant Cedar, CMA; 08/06/2014 10:26 AM) Penicillins Shortness of  breath.  Medication History Briant Cedar, CMA; 08/06/2014 10:28 AM) Cholecalciferol (1000UNIT Tablet, Oral) Active. Cyanocobalamin (1000MCG Tab Sublingual, Sublingual) Active.  Social History Briant Cedar, [place]; 08/06/2014 10:22 AM) No alcohol use No caffeine use No drug use Tobacco use Never smoker.  Family History Briant Cedar, [place]; 08/06/2014 10:22 AM) Arthritis Mother. Colon Cancer Family Members In General, Father.  Review of Systems Briant Cedar CMA; 08/06/2014 10:22 AM) General Present- Fatigue and Night Sweats. Not Present- Appetite Loss, Chills, Fever, Weight Gain and Weight Loss. Skin Not Present- Change in Wart/Mole, Dryness, Hives, Jaundice, New Lesions, Non-Healing Wounds, Rash and Ulcer. HEENT Not Present- Earache, Hearing Loss, Hoarseness, Nose Bleed, Oral Ulcers, Ringing in the Ears, Seasonal Allergies, Sinus Pain, Sore Throat, Visual Disturbances, Wears glasses/contact lenses and Yellow Eyes. Respiratory Not Present- Bloody sputum, Chronic Cough, Difficulty Breathing, Snoring and Wheezing. Breast Not Present- Breast Mass, Breast Pain, Nipple Discharge and Skin Changes. Cardiovascular Not Present- Chest Pain, Difficulty Breathing Lying Down, Leg Cramps, Palpitations, Rapid Heart Rate, Shortness of Breath and Swelling of Extremities. Gastrointestinal Present- Abdominal Pain, Bloating, Bloody Stool, Change in Bowel Habits, Chronic diarrhea, Excessive gas, Gets full quickly at meals, Hemorrhoids, Nausea and Rectal Pain. Not Present- Constipation, Difficulty Swallowing, Indigestion and Vomiting. Male Genitourinary Not Present- Blood in Urine, Change in Urinary Stream, Frequency, Impotence, Nocturia, Painful Urination, Urgency and Urine Leakage. Musculoskeletal Not Present- Back Pain, Joint Pain, Joint Stiffness, Muscle Pain, Muscle Weakness and Swelling of Extremities. Neurological Not Present- Decreased Memory, Fainting, Headaches, Numbness, Seizures,  Tingling, Tremor, Trouble walking and Weakness. Psychiatric Not Present- Anxiety, Bipolar, Change in Sleep Pattern, Depression, Fearful and Frequent crying. Endocrine Not Present- Cold Intolerance, Excessive Hunger, Hair Changes, Heat Intolerance, Hot flashes and New Diabetes. Hematology  Not Present- Easy Bruising, Excessive bleeding, Gland problems, HIV and Persistent Infections.   Vitals Briant Cedar CMA; 08/06/2014 10:32 AM) 08/06/2014 10:28 AM Weight: 173.38 lb Height: 74in Body Surface Area: 2.03 m Body Mass Index: 22.26 kg/m Temp.: 97.48F  Pulse: 62 (Regular)  BP: 124/90 (Sitting, Left Arm, Standard)    Physical Exam Randall Hiss M. Zareya Tuckett MD; 08/10/2014 3:31 PM) General Mental Status-Alert. General Appearance-Consistent with stated age. Hydration-Well hydrated. Voice-Normal.  Head and Neck Head-normocephalic, atraumatic with no lesions or palpable masses. Trachea-midline. Thyroid Gland Characteristics - normal size and consistency.  Eye Eyeball - Bilateral-Extraocular movements intact. Sclera/Conjunctiva - Bilateral-No scleral icterus.  Chest and Lung Exam Chest and lung exam reveals -quiet, even and easy respiratory effort with no use of accessory muscles and on auscultation, normal breath sounds, no adventitious sounds and normal vocal resonance. Inspection Chest Wall - Normal. Back - normal.  Breast - Did not examine.  Cardiovascular Cardiovascular examination reveals -normal heart sounds, regular rate and rhythm with no murmurs and normal pedal pulses bilaterally.  Abdomen Inspection Inspection of the abdomen reveals - No Hernias. Skin - Scar - no surgical scars. Palpation/Percussion Palpation and Percussion of the abdomen reveal - Soft, Non Tender, No Rebound tenderness, No Rigidity (guarding) and No hepatosplenomegaly. Auscultation Auscultation of the abdomen reveals - Bowel sounds normal.  Peripheral Vascular Upper  Extremity Palpation - Pulses bilaterally normal.  Neurologic Neurologic evaluation reveals -alert and oriented x 3 with no impairment of recent or remote memory. Mental Status-Normal.  Neuropsychiatric The patient's mood and affect are described as -normal. Judgment and Insight-insight is appropriate concerning matters relevant to self.  Musculoskeletal Normal Exam - Left-Upper Extremity Strength Normal and Lower Extremity Strength Normal. Normal Exam - Right-Upper Extremity Strength Normal and Lower Extremity Strength Normal.  Lymphatic Head & Neck  General Head & Neck Lymphatics: Bilateral - Description - Normal. Axillary - Did not examine. Femoral & Inguinal - Did not examine.    Assessment & Plan Randall Hiss M. Jasalyn Frysinger MD; 08/10/2014 3:38 PM) SYMPTOMATIC CHOLELITHIASIS (574.20  K80.20) Impression: I believe some of the patient's symptoms are consistent with gallbladder disease. I advised him that the excessive gas and blood on toilet tissue is not due to his gallbladder.  We discussed gallbladder disease. The patient was given Neurosurgeon. We discussed non-operative and operative management. We discussed the signs & symptoms of acute cholecystitis  I discussed laparoscopic cholecystectomy with IOC in detail. The patient was given educational material as well as diagrams detailing the procedure. We discussed the risks and benefits of a laparoscopic cholecystectomy including, but not limited to bleeding, infection, injury to surrounding structures such as the intestine or liver, bile leak, retained gallstones, need to convert to an open procedure, prolonged diarrhea, blood clots such as DVT, common bile duct injury, anesthesia risks, and possible need for additional procedures. We discussed the typical post-operative recovery course. I explained that the likelihood of improvement of their symptoms is good.  The patient has elected to proceed with surgery. Current  Plans  Instructions: Pt Education - Laparoscopic Cholecystectomy: cholecystectomy INCREASED MUCUS IN STOOL (792.1  R19.5) Current Plans Pt Education - [facility] Good Bowel Health ([place])  Leighton Ruff. Redmond Pulling, MD, FACS General, Bariatric, & Minimally Invasive Surgery [facility], [place]

## 2014-09-02 NOTE — Anesthesia Preprocedure Evaluation (Addendum)
Anesthesia Evaluation  Patient identified by MRN, date of birth, ID band Patient awake    Reviewed: Allergy & Precautions, H&P , NPO status , Patient's Chart, lab work & pertinent test results  History of Anesthesia Complications Negative for: history of anesthetic complications  Airway Mallampati: II  TM Distance: >3 FB Neck ROM: Full    Dental  (+) Teeth Intact, Dental Advisory Given   Pulmonary neg pulmonary ROS,  breath sounds clear to auscultation        Cardiovascular negative cardio ROS  Rhythm:Regular     Neuro/Psych negative neurological ROS  negative psych ROS   GI/Hepatic negative GI ROS, Neg liver ROS,   Endo/Other  negative endocrine ROS  Renal/GU negative Renal ROS     Musculoskeletal  (+) Arthritis -,   Abdominal   Peds  Hematology negative hematology ROS (+)   Anesthesia Other Findings   Reproductive/Obstetrics                            Anesthesia Physical Anesthesia Plan  ASA: II  Anesthesia Plan: General   Post-op Pain Management:    Induction: Intravenous  Airway Management Planned: Oral ETT  Additional Equipment:   Intra-op Plan:   Post-operative Plan: Extubation in OR  Informed Consent: I have reviewed the patients History and Physical, chart, labs and discussed the procedure including the risks, benefits and alternatives for the proposed anesthesia with the patient or authorized representative who has indicated his/her understanding and acceptance.   Dental advisory given  Plan Discussed with: Surgeon and CRNA  Anesthesia Plan Comments:        Anesthesia Quick Evaluation

## 2014-09-02 NOTE — Discharge Instructions (Signed)
[facility]: POST OP INSTRUCTIONS Always review your discharge instruction sheet given to you by the facility where your surgery was performed. IF YOU HAVE DISABILITY OR FAMILY LEAVE FORMS, YOU MUST BRING THEM TO THE OFFICE FOR PROCESSING.   DO NOT GIVE THEM TO YOUR DOCTOR.  1. A prescription for pain medication may be given to you upon discharge.  Take your pain medication as prescribed, if needed.  If narcotic pain medicine is not needed, then you may take acetaminophen (Tylenol) or ibuprofen (Advil) as needed. 2. Take your usually prescribed medications unless otherwise directed. 3. If you need a refill on your pain medication, please contact your pharmacy.  They will contact our office to request authorization. Prescriptions will not be filled after 5pm or on week-ends. 4. You should follow a light diet the first few days after arrival home, such as soup and crackers, etc.  Be sure to include lots of fluids daily. 5. Most patients will experience some swelling and bruising in the area of the incisions.  Ice packs will help.  Swelling and bruising can take several days to resolve.  6. It is common to experience some constipation if taking pain medication after surgery.  Increasing fluid intake and taking a stool softener (such as Colace) will usually help or prevent this problem from occurring.  A mild laxative (Milk of Magnesia or Miralax) should be taken according to package instructions if there are no bowel movements after 48 hours. 7. Unless discharge instructions indicate otherwise, you may remove your bandages 24-48 hours after surgery, and you may shower at that time.  You may have steri-strips (small skin tapes) in place directly over the incision.  These strips should be left on the skin for 7-10 days.  If your surgeon used skin glue on the incision, you may shower in 24 hours.  The glue will flake off over the next 2-3 weeks.  Any sutures or  staples will be removed at the office during your follow-up visit. 8. ACTIVITIES:  You may resume regular (light) daily activities beginning the next day--such as daily self-care, walking, climbing stairs--gradually increasing activities as tolerated.  You may have sexual intercourse when it is comfortable.  Refrain from any heavy lifting or straining until approved by your doctor. a. You may drive when you are no longer taking prescription pain medication, you can comfortably wear a seatbelt, and you can safely maneuver your car and apply brakes. 9. You should see your doctor in the office for a follow-up appointment approximately 2-3 weeks after your surgery.  Make sure that you call for this appointment within a day or two after you arrive home to insure a convenient appointment time. 10. OTHER INSTRUCTIONS:DO NOT LIFT, PUSH, OR PULL ANYTHING GREATER THAN 15 POUNDS FOR 2 WEEKS  WHEN TO CALL YOUR DOCTOR: 1. Fever over 101.0 2. Inability to urinate 3. Continued bleeding from incision. 4. Increased pain, redness, or drainage from the incision. 5. Increasing abdominal pain  The clinic staff is available to answer your questions during regular business hours.  Please dont hesitate to call and ask to speak to one of the nurses for clinical concerns.  If you have a medical emergency, go to the nearest emergency room or call 911.  A surgeon from [facility] is always on call at the hospital. [address], [place], [place], [place]  [address] ? [address], [place], [place]   [address] [phone] ? [phone] ?  FAX [phone] Web site: [email or web address]   General Anesthesia, Adult, Care After  Refer to this sheet in the next few weeks. These instructions provide you with information on caring for yourself after your procedure. Your health care provider may also give you more specific instructions. Your treatment has been planned according to current medical  practices, but problems sometimes occur. Call your health care provider if you have any problems or questions after your procedure.  WHAT TO EXPECT AFTER THE PROCEDURE  After the procedure, it is typical to experience:  Sleepiness.  Nausea and vomiting. HOME CARE INSTRUCTIONS  For the first 24 hours after general anesthesia:  Have a responsible person with you.  Do not drive a car. If you are alone, do not take public transportation.  Do not drink alcohol.  Do not take medicine that has not been prescribed by your health care provider.  Do not sign important papers or make important decisions.  You may resume a normal diet and activities as directed by your health care provider.  Change bandages (dressings) as directed.  If you have questions or problems that seem related to general anesthesia, call the hospital and ask for the anesthetist or anesthesiologist on call. SEEK MEDICAL CARE IF:  You have nausea and vomiting that continue the day after anesthesia.  You develop a rash. SEEK IMMEDIATE MEDICAL CARE IF:  You have difficulty breathing.  You have chest pain.  You have any allergic problems. Document Released: 01/15/2001 Document Revised: 06/11/2013 Document Reviewed: 04/24/2013  [facility] Patient Information 2014 [place], [place].

## 2014-09-02 NOTE — Anesthesia Procedure Notes (Signed)
Procedure Name: Intubation Date/Time: 09/02/2014 8:48 AM Performed by: Manuela Schwartz B Pre-anesthesia Checklist: Patient identified, Emergency Drugs available, Suction available, Patient being monitored and Timeout performed Patient Re-evaluated:Patient Re-evaluated prior to inductionOxygen Delivery Method: Circle system utilized Preoxygenation: Pre-oxygenation with 100% oxygen Intubation Type: IV induction and Rapid sequence Laryngoscope Size: Mac and 4 Grade View: Grade I Tube type: Oral Tube size: 7.5 mm Number of attempts: 1 Airway Equipment and Method: Stylet Placement Confirmation: ETT inserted through vocal cords under direct vision,  positive ETCO2 and breath sounds checked- equal and bilateral Secured at: 23 cm Tube secured with: Tape Dental Injury: Teeth and Oropharynx as per pre-operative assessment

## 2014-09-02 NOTE — Transfer of Care (Signed)
Immediate Anesthesia Transfer of Care Note  Patient: Joshua Walton  Procedure(s) Performed: Procedure(s): LAPAROSCOPIC CHOLECYSTECTOMY WITH INTRAOPERATIVE CHOLANGIOGRAM (N/A)  Patient Location: PACU  Anesthesia Type:General  Level of Consciousness: awake, alert  and oriented  Airway & Oxygen Therapy: Patient Spontanous Breathing  Post-op Assessment: Report given to PACU RN and Post -op Vital signs reviewed and stable  Post vital signs: Reviewed and stable  Complications: No apparent anesthesia complications

## 2014-09-02 NOTE — Op Note (Signed)
Joshua Walton [id number] 1966/06/27 09/02/2014  Laparoscopic Cholecystectomy with IOC Procedure Note  Indications: This patient presents with symptomatic gallbladder disease and will undergo laparoscopic cholecystectomy.  Pre-operative Diagnosis: symptomatic cholelithiasis  Post-operative Diagnosis: Same  Surgeon: Gayland Curry   Assistants: none  Anesthesia: General endotracheal anesthesia  ASA Class: 2  Procedure Details  The patient was seen again in the Holding Room. The risks, benefits, complications, treatment options, and expected outcomes were discussed with the patient. The possibilities of reaction to medication, pulmonary aspiration, perforation of viscus, bleeding, recurrent infection, finding a normal gallbladder, the need for additional procedures, failure to diagnose a condition, the possible need to convert to an open procedure, and creating a complication requiring transfusion or operation were discussed with the patient. The likelihood of improving the patient's symptoms with return to their baseline status is good.  The patient and/or family concurred with the proposed plan, giving informed consent. The site of surgery properly noted. The patient was taken to Operating Room, identified as Joshua Walton and the procedure verified as Laparoscopic Cholecystectomy with Intraoperative Cholangiogram. A Time Out was held and the above information confirmed. Antibiotic prophylaxis was administered.   Prior to the induction of general anesthesia, antibiotic prophylaxis was administered. General endotracheal anesthesia was then administered and tolerated well. After the induction, the abdomen was prepped with Chloraprep and draped in the sterile fashion. The patient was positioned in the supine position.  Local anesthetic agent was injected into the skin near the umbilicus and an incision made. We dissected down to the abdominal fascia with blunt dissection.  The fascia was incised  vertically and we entered the peritoneal cavity bluntly.  A pursestring suture of 0-Vicryl was placed around the fascial opening.  The Hasson cannula was inserted and secured with the stay suture.  Pneumoperitoneum was then created with CO2 and tolerated well without any adverse changes in the patient's vital signs. An 5-mm port was placed in the subxiphoid position.  Two 5-mm ports were placed in the right upper quadrant. All skin incisions were infiltrated with a local anesthetic agent before making the incision and placing the trocars.   We positioned the patient in reverse Trendelenburg, tilted slightly to the patient's left.  The gallbladder was identified, the fundus grasped and retracted cephalad. Adhesions were lysed bluntly and with the electrocautery where indicated, taking care not to injure any adjacent organs or viscus. He had a large amount of gallstones in the infundibulum. The infundibulum was grasped and retracted laterally, exposing the peritoneum overlying the triangle of Calot. This was then divided and exposed in a blunt fashion. A critical view of the cystic duct and cystic artery was obtained. The cystic duct appeared short.  The cystic duct was clearly identified and bluntly dissected circumferentially. The cystic duct was ligated with a clip distally.   An incision was made in the cystic duct and the [facility] cholangiogram catheter introduced. The catheter was secured using a clip. A cholangiogram was then obtained which showed good visualization of the distal and proximal biliary tree with no sign of filling defects or obstruction.  Contrast flowed easily into the duodenum. The catheter was then removed.   The cystic duct was then ligated with clips and divided. The cystic artery which had been identified & dissected free was ligated with clips and divided as well.   The gallbladder was dissected from the liver bed in retrograde fashion with the electrocautery. The gallbladder was removed  and placed in an Endocatch sac.  The  gallbladder and Endocatch sac were then removed through the umbilical port site after the fascia was enlarged due to the size of the gallbladder. The liver bed was irrigated and inspected. Hemostasis was achieved with the electrocautery. Copious irrigation was utilized and was repeatedly aspirated until clear.  The pursestring suture was used to close the umbilical fascia.  2 additional interrupted 0 vicryl sutures were placed at the umbilical fascia.  We again inspected the right upper quadrant for hemostasis.  The umbilical closure was inspected and there was no air leak and nothing trapped within the closure. Pneumoperitoneum was released as we removed the trocars.  4-0 Monocryl was used to close the skin.   Benzoin, steri-strips, and clean dressings were applied. The patient was then extubated and brought to the recovery room in stable condition. Instrument, sponge, and needle counts were correct at closure and at the conclusion of the case.   Findings: Chronic Cholecystitis with Cholelithiasis - numerous large GS  Estimated Blood Loss: Minimal         Drains: none         Specimens: Gallbladder           Complications: None; patient tolerated the procedure well.         Disposition: PACU - hemodynamically stable.         Condition: stable  Leighton Ruff. Redmond Pulling, MD, FACS General, Bariatric, & Minimally Invasive Surgery [facility], [place]

## 2014-09-03 ENCOUNTER — Encounter (HOSPITAL_COMMUNITY): Payer: Self-pay | Admitting: General Surgery

## 2014-09-09 ENCOUNTER — Encounter: Payer: Self-pay | Admitting: Gastroenterology

## 2014-09-09 ENCOUNTER — Ambulatory Visit (INDEPENDENT_AMBULATORY_CARE_PROVIDER_SITE_OTHER): Payer: BC Managed Care – PPO | Admitting: Gastroenterology

## 2014-09-09 VITALS — BP 126/80 | HR 56 | Ht 74.0 in | Wt 170.5 lb

## 2014-09-09 DIAGNOSIS — R140 Abdominal distension (gaseous): Secondary | ICD-10-CM

## 2014-09-09 DIAGNOSIS — Z86010 Personal history of colonic polyps: Secondary | ICD-10-CM

## 2014-09-09 DIAGNOSIS — Z800 Family history of malignant neoplasm of digestive organs: Secondary | ICD-10-CM | POA: Insufficient documentation

## 2014-09-09 MED ORDER — PANTOPRAZOLE SODIUM 40 MG PO TBEC: 40.0000 mg | DELAYED_RELEASE_TABLET | Freq: Every day | ORAL | Status: AC

## 2014-09-09 NOTE — Assessment & Plan Note (Signed)
Patient has very nonspecific symptoms of dyspepsia.  He probably underwent workup in [place] without a specific diagnosis. At this point it is difficult to ascertain whether symptoms he is having is attributable to GERD, IBS or perhaps changes related to undergoing a cholecystectomy.  Accordingly, I don't want to pursue workup any further but instead allow things to settle down for several months and then reevaluate.  In the interim he will try Protonix again for this 2 weeks.  If symptoms are not improved he will discontinue this.

## 2014-09-09 NOTE — Progress Notes (Signed)
_                                                                                                                History of Present Illness:  [name] a pleasant 48 year old white male referred for evaluation of dyspepsia.  For years he's had nonspecific symptoms of bloating and discomfort.  Several years ago he had severe sharp upper abdominal pain was found to have gallbladder stones.  He underwent a cholecystectomy with IOC about one week ago.  IOC was normal.  He has occasional urgency postprandially.  Family history is pertinent for colon cancer.  He underwent colonoscopy approximately 9 months ago where benign polyps apparently were removed.  He denies pyrosis or nausea.   Past Medical History  Diagnosis Date  . LBP (low back pain)   . History of syncope   . Cholelithiasis   . History of fracture of leg 2005  . GERD (gastroesophageal reflux disease)   . Arthritis   . History of DVT (deep vein thrombosis) 2005    Associated with leg fracture  . Bradycardia    Past Surgical History  Procedure Laterality Date  . Tonsillectomy and adenoidectomy    . Orchiopexy      Child  . Cholecystectomy N/A 09/02/2014    Procedure: LAPAROSCOPIC CHOLECYSTECTOMY WITH INTRAOPERATIVE CHOLANGIOGRAM;  Surgeon: Gayland Curry, MD;  Location: [place];  Service: General;  Laterality: N/A;   family history includes Colon cancer (age of onset: 35) in his paternal grandmother; Colon cancer (age of onset: 54) in his father. There is no history of Colon polyps, Diabetes, Kidney disease, or Esophageal cancer. Current Outpatient Prescriptions  Medication Sig Dispense Refill  . cholecalciferol (VITAMIN D) 1000 UNITS tablet Take 1 tablet (1,000 Units total) by mouth daily. 100 tablet 3  . Cyanocobalamin (VITAMIN B-12) 1000 MCG SUBL Place 1 tablet (1,000 mcg total) under the tongue daily. 100 tablet 3  . oxyCODONE-acetaminophen (PERCOCET/ROXICET) 5-325 MG per tablet Take 1-2 tablets by mouth  every 4 (four) hours as needed for severe pain. 40 tablet 0   No current facility-administered medications for this visit.   Allergies as of 09/09/2014 - Review Complete 09/09/2014  Allergen Reaction Noted  . Penicillins Shortness Of Breath 03/04/2014    reports that he has never smoked. He does not have any smokeless tobacco history on file. He reports that he does not drink alcohol or use illicit drugs.   Review of Systems: Pertinent positive and negative review of systems were noted in the above HPI section. All other review of systems were otherwise negative.  Vital signs were reviewed in today's medical record Physical Exam: General: Well developed , well nourished, no acute distress Skin: anicteric Head: Normocephalic and atraumatic Eyes:  sclerae anicteric, EOMI Ears: Normal auditory acuity Mouth: No deformity or lesions Neck: Supple, no masses or thyromegaly Lungs: Clear throughout to auscultation Heart: Regular rate and rhythm; no murmurs, rubs or bruits Abdomen: Soft, non tender and non distended. No masses, hepatosplenomegaly or hernias  noted. Normal Bowel sounds Rectal:deferred Musculoskeletal: Symmetrical with no gross deformities  Skin: No lesions on visible extremities Pulses:  Normal pulses noted Extremities: No clubbing, cyanosis, edema or deformities noted Neurological: Alert oriented x 4, grossly nonfocal Cervical Nodes:  No significant cervical adenopathy Inguinal Nodes: No significant inguinal adenopathy Psychological:  Alert and cooperative. Normal mood and affect  See Assessment and Plan under Problem List

## 2014-09-09 NOTE — Patient Instructions (Signed)
Your prescription is being sent to your pharmacy Protonix   Follow up in 3-4 months

## 2014-09-09 NOTE — Assessment & Plan Note (Signed)
Plan colonoscopy at 5 year intervals 

## 2015-01-08 ENCOUNTER — Ambulatory Visit (INDEPENDENT_AMBULATORY_CARE_PROVIDER_SITE_OTHER): Payer: 59 | Admitting: Internal Medicine

## 2015-01-08 VITALS — BP 138/80 | HR 56 | Temp 98.3°F | Resp 14 | Ht 74.0 in | Wt 178.0 lb

## 2015-01-08 DIAGNOSIS — Z0000 Encounter for general adult medical examination without abnormal findings: Secondary | ICD-10-CM

## 2015-01-08 NOTE — Progress Notes (Signed)
Pre visit review using our clinic review tool, if applicable. No additional management support is needed unless otherwise documented below in the visit note. 

## 2015-01-08 NOTE — Progress Notes (Signed)
   Subjective:    HPI    F/u abd gas and bloating since Christmas 2014. Joshua Walton had a GI w/up in [place]. His H pylori was (-). No n/v. No wt loss, diarrhea. He had a colonoscopy in 2014 - nl. Joshua Walton tells me now that he had gallstones diagnosed 10 years ago (no surgery)  He had a constipation problem prior He has a remote h/o bradycardia/syncope - none in the past few years  F/u hands OA  Wt Readings from Last 3 Encounters:  01/08/15 178 lb (80.74 kg)  09/09/14 170 lb 8 oz (77.338 kg)  08/27/14 173 lb (78.472 kg)    BP Readings from Last 3 Encounters:  01/08/15 138/80  09/09/14 126/80  09/02/14 140/87         Review of Systems  Constitutional: Negative for appetite change, fatigue and unexpected weight change.  HENT: Negative for congestion, dental problem, ear pain, nosebleeds, sneezing, sore throat and trouble swallowing.   Eyes: Negative for itching and visual disturbance.  Respiratory: Negative for cough.   Cardiovascular: Negative for chest pain, palpitations and leg swelling.  Gastrointestinal: Positive for constipation. Negative for nausea, vomiting, abdominal pain, diarrhea, blood in stool, abdominal distention and rectal pain.  Genitourinary: Negative for frequency and hematuria.  Musculoskeletal: Negative for back pain, joint swelling, gait problem and neck pain.  Skin: Negative for rash.  Neurological: Negative for dizziness, tremors, speech difficulty and weakness.  Psychiatric/Behavioral: Negative for suicidal ideas, sleep disturbance, dysphoric mood, decreased concentration and agitation. The patient is not nervous/anxious.        Objective:   Physical Exam  Constitutional: He is oriented to person, place, and time. He appears well-developed. No distress.  NAD  HENT:  Mouth/Throat: Oropharynx is clear and moist.  Eyes: Conjunctivae are normal. Pupils are equal, round, and reactive to light.  Neck: Normal range of motion. No JVD present. No  thyromegaly present.  Cardiovascular: Normal rate, regular rhythm, normal heart sounds and intact distal pulses.  Exam reveals no gallop and no friction rub.   No murmur heard. Pulmonary/Chest: Effort normal and breath sounds normal. No respiratory distress. He has no wheezes. He has no rales. He exhibits no tenderness.  Abdominal: Soft. Bowel sounds are normal. He exhibits no distension and no mass. There is no tenderness. There is no rebound and no guarding.  Musculoskeletal: Normal range of motion. He exhibits no edema or tenderness.  Lymphadenopathy:    He has no cervical adenopathy.  Neurological: He is alert and oriented to person, place, and time. He has normal reflexes. No cranial nerve deficit. He exhibits normal muscle tone. He displays a negative Romberg sign. Coordination and gait normal.  No meningeal signs  Skin: Skin is warm and dry. No rash noted.  Psychiatric: He has a normal mood and affect. His behavior is normal. Judgment and thought content normal.     Lab Results  Component Value Date   WBC 4.5 08/25/2014   HGB 13.4 08/25/2014   HCT 39.5 08/25/2014   PLT 261 08/25/2014   GLUCOSE 96 08/25/2014   ALT 16 07/07/2014   AST 23 07/07/2014   NA 143 08/25/2014   K 3.6* 08/25/2014   CL 104 08/25/2014   CREATININE 0.66 08/25/2014   BUN 9 08/25/2014   CO2 27 08/25/2014   TSH 1.65 03/04/2014        Assessment & Plan:  Patient ID: Joshua Walton, male   DOB: Feb 25, 1966, 49 y.o.   MRN: [id number]

## 2015-03-26 ENCOUNTER — Encounter: Payer: Self-pay | Admitting: Internal Medicine

## 2016-06-28 IMAGING — US US ABDOMEN COMPLETE
1 series · 14 of 25 positions shown · non-contrast
Comparison: None.

CLINICAL DATA: Gallstones and bloating.  Nausea

EXAM:
ULTRASOUND ABDOMEN COMPLETE

[Series 1: us abdomen complete · 0.19mm/px · 14 of 87 slices shown]
[im 1/87]
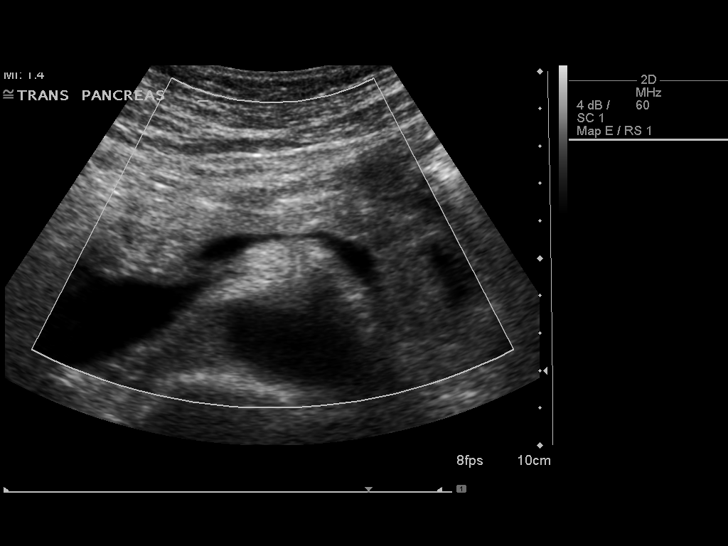
[im 8/87]
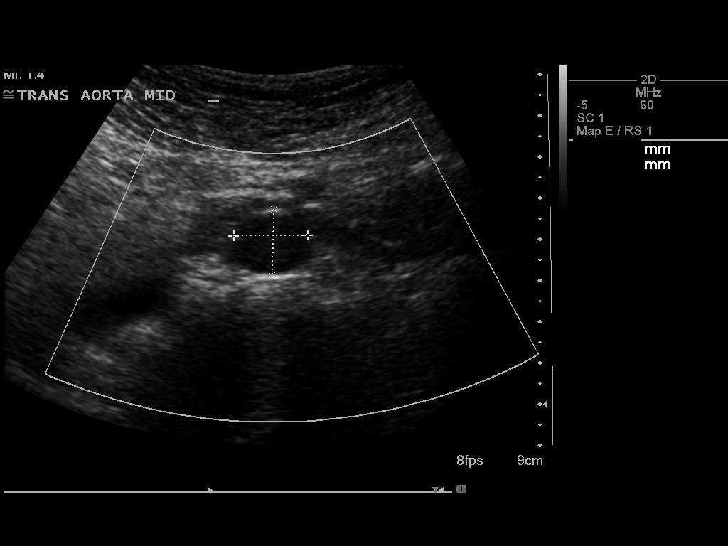
[im 15/87]
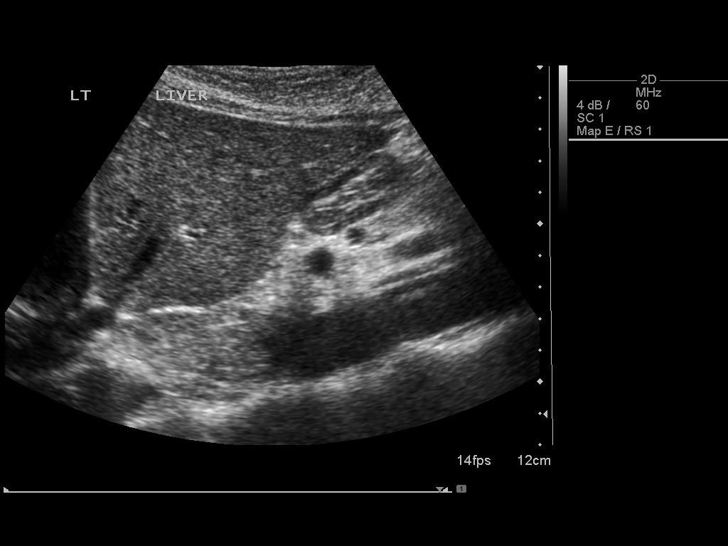
[im 22/87]
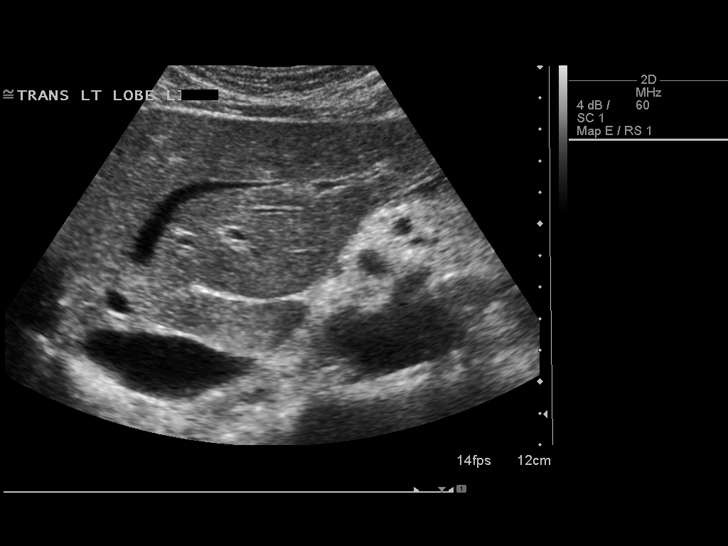
[im 29/87]
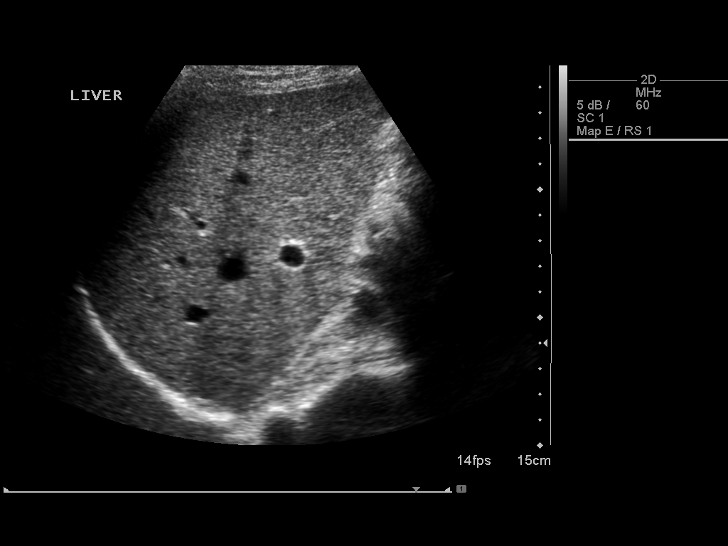
[im 33/87]
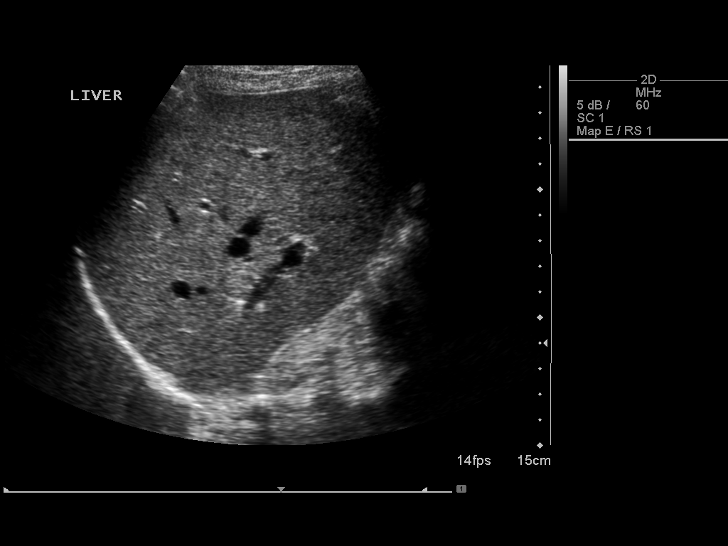
[im 40/87]
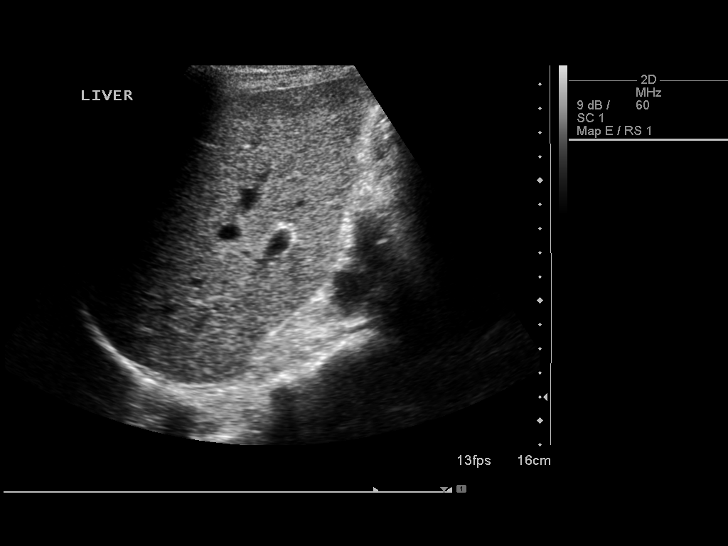
[im 47/87]
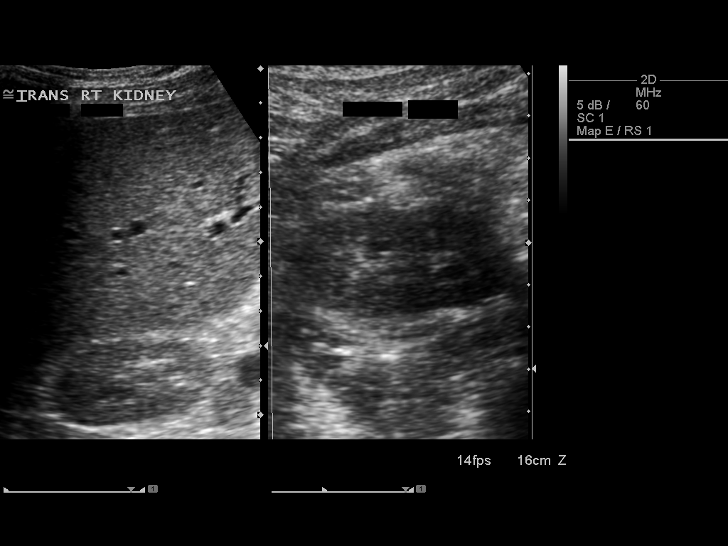
[im 54/87]
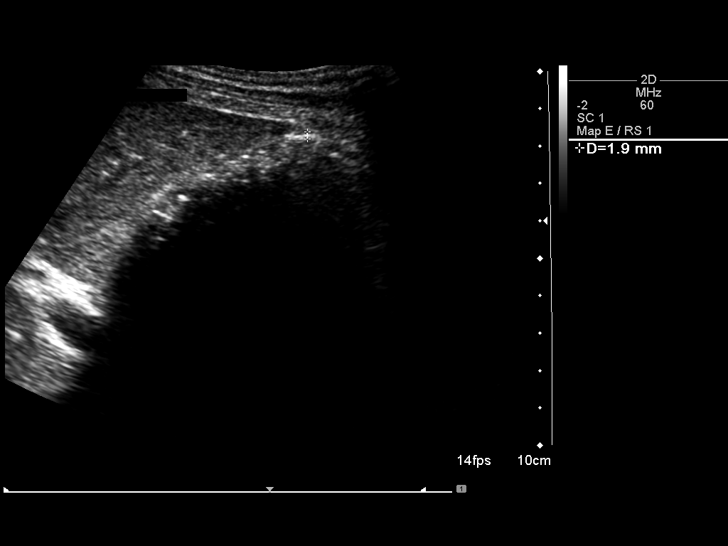
[im 58/87]
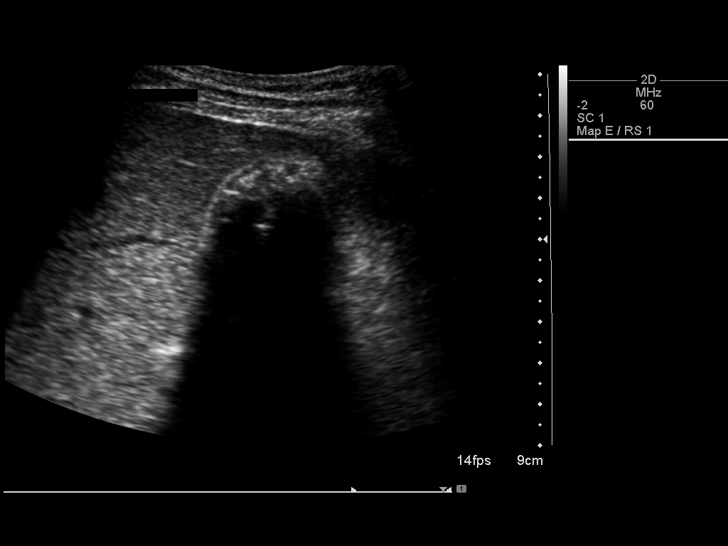
[im 65/87]
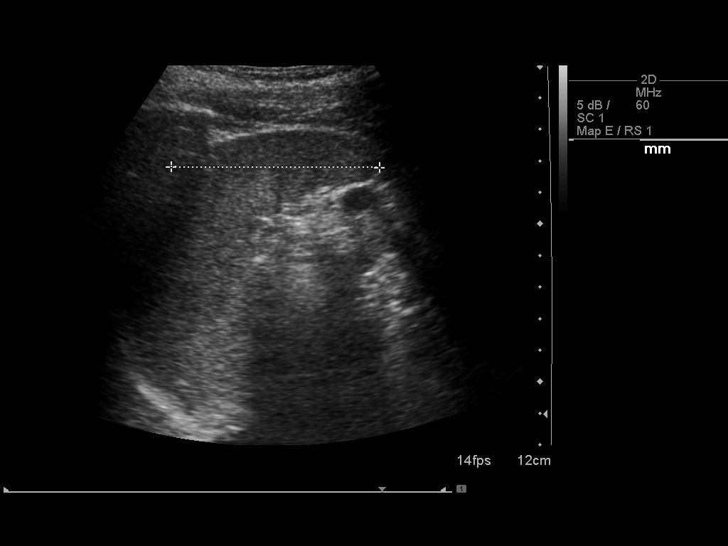
[im 72/87]
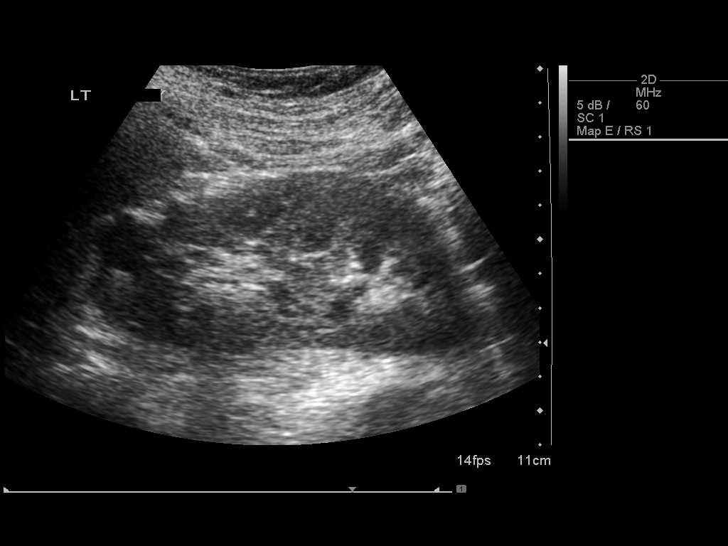
[im 79/87]
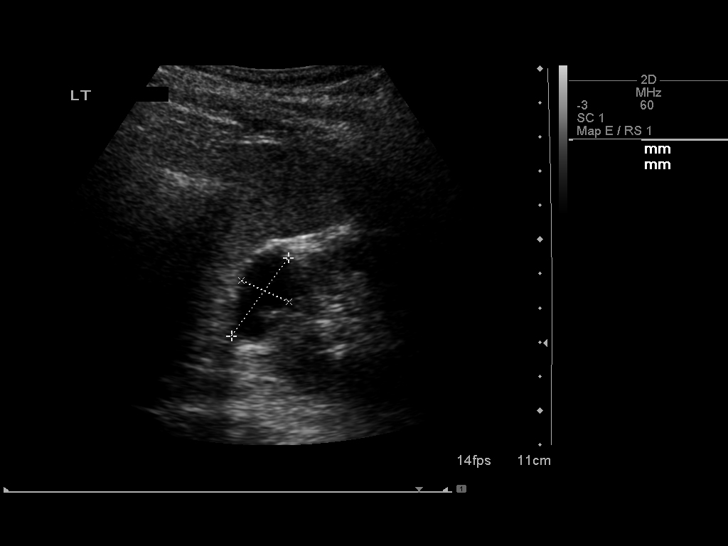
[im 87/87]
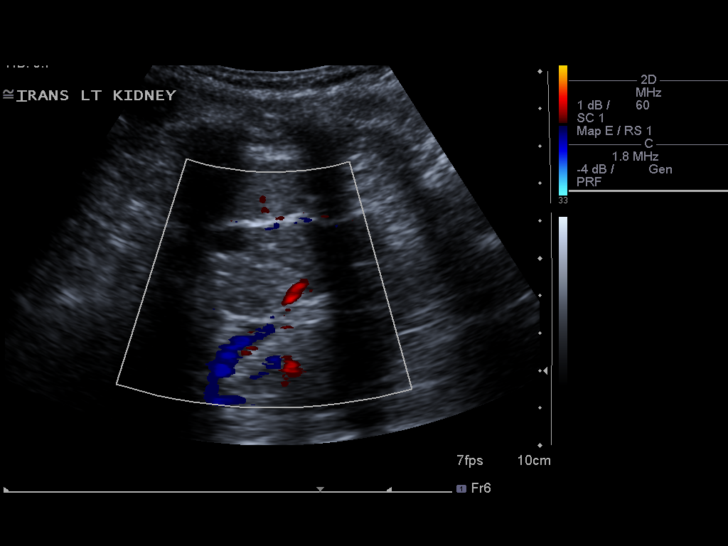

[14 of 25 positions shown; findings below may reference images not displayed]

FINDINGS: Gallbladder:

Multiple gallstones packed within the lumen of the gallbladder.
There is dense posterior shadowing from these gallstones which
limits evaluation of the gallbladder. There is no pericholecystic
fluid. Negative sonographic Murphy's sign.

Common bile duct:

Diameter: Normal at 3 mm

Liver:

No focal lesion identified. Within normal limits in parenchymal
echogenicity.

IVC:

No abnormality visualized.

Pancreas:

Visualized portion unremarkable.

Spleen:

Size and appearance within normal limits.

Right Kidney:

Length: 12.9 cm.  Is 1.8 cm anechoic cyst in the mid right kidney.

Left Kidney:

Length: 11.6 in in the versus. Several anechoic cysts in the mid and
lower left kidney. No hydronephrosis

Abdominal aorta:

No aneurysm visualized.

Other findings:

No free fluid
IMPRESSION: 1. Multiple gallstones densely packed within the gallbladder lumen.
No sonographic Murphy sign or pericholecystic fluid to suggest acute
cholecystitis.
2. Benign-appearing renal cysts.  No hydronephrosis.

## 2016-08-14 IMAGING — RF DG CHOLANGIOGRAM OPERATIVE
1 series · 6 of 6 positions shown · non-contrast
Comparison: Ultrasound on 07/17/2014

CLINICAL DATA: Cholecystectomy for cholelithiasis.

EXAM:
INTRAOPERATIVE CHOLANGIOGRAM
TECHNIQUE: Cholangiographic images from the C-arm fluoroscopic device were
submitted for interpretation post-operatively. Please see the
procedural report for the amount of contrast and the fluoroscopy
time utilized.

[Series 1: run · 3 acquisitions, 6 frames shown]
[im 1/3]
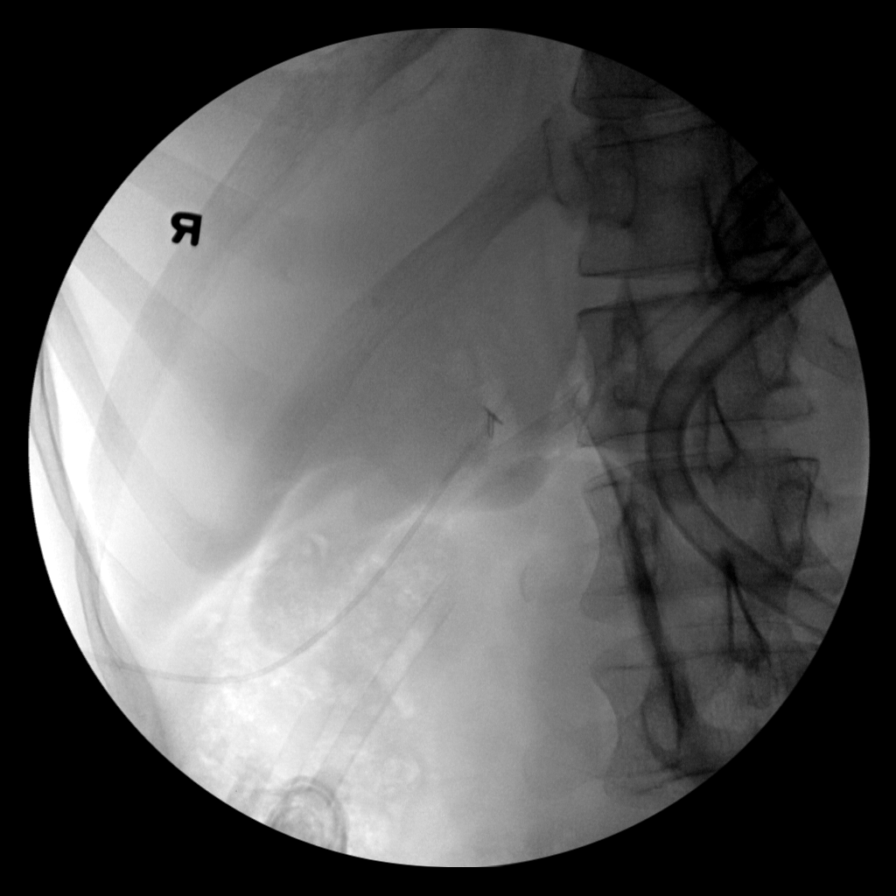
[im 1/3]
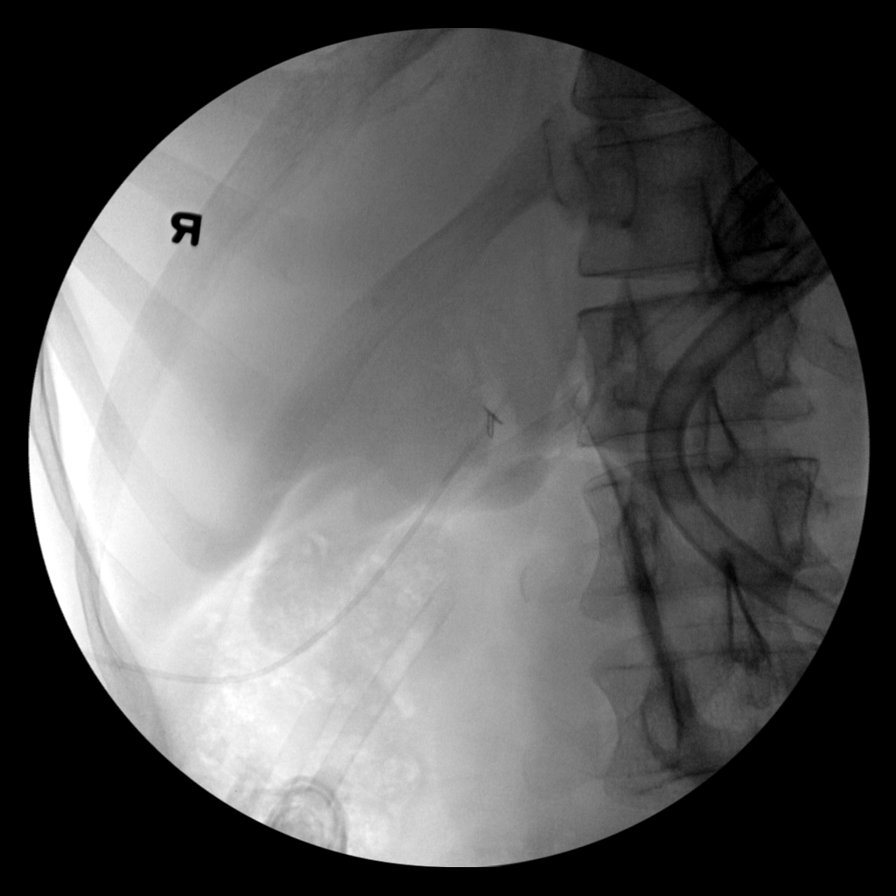
[im 1/3]
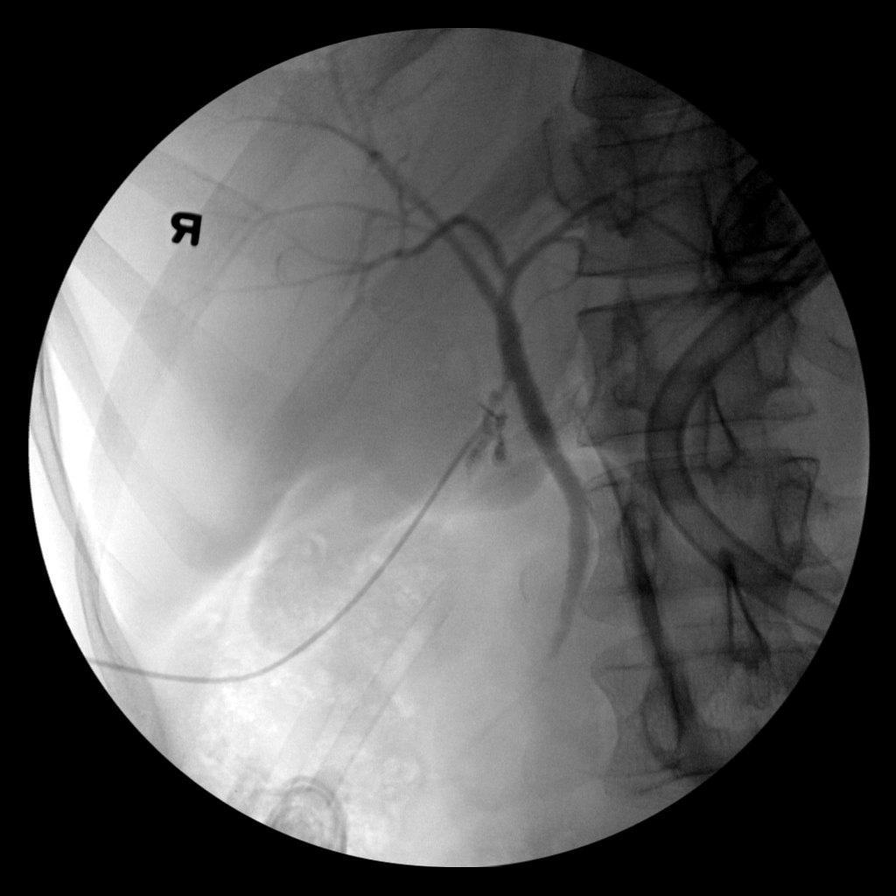
[im 1/3]
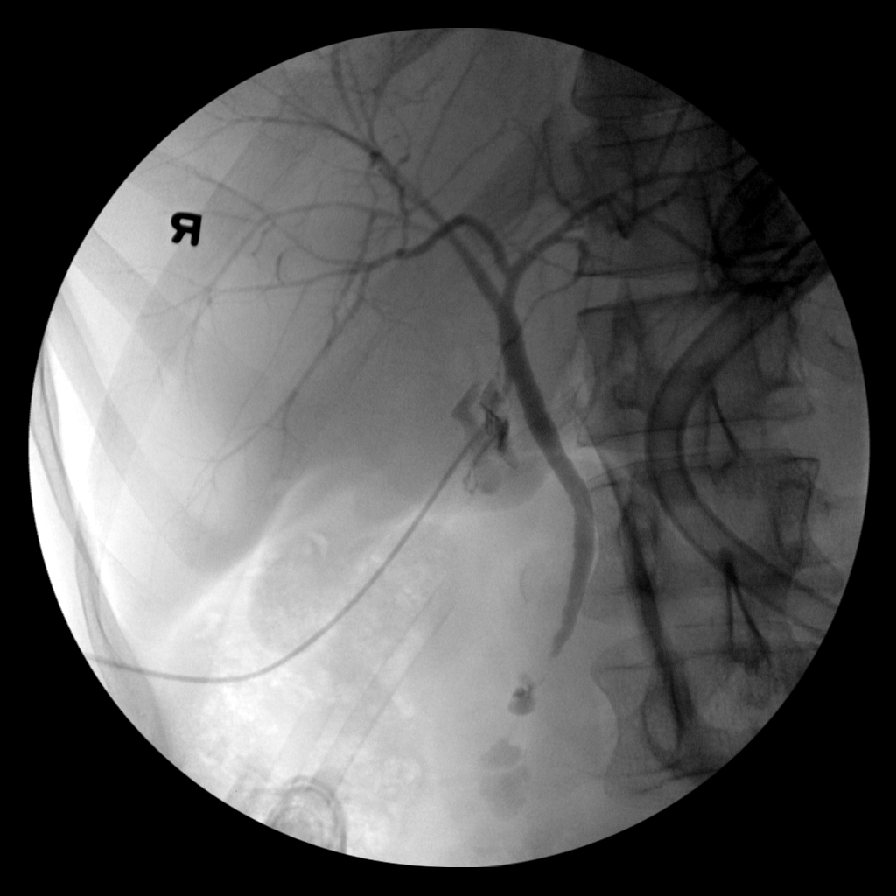
[im 2/3]
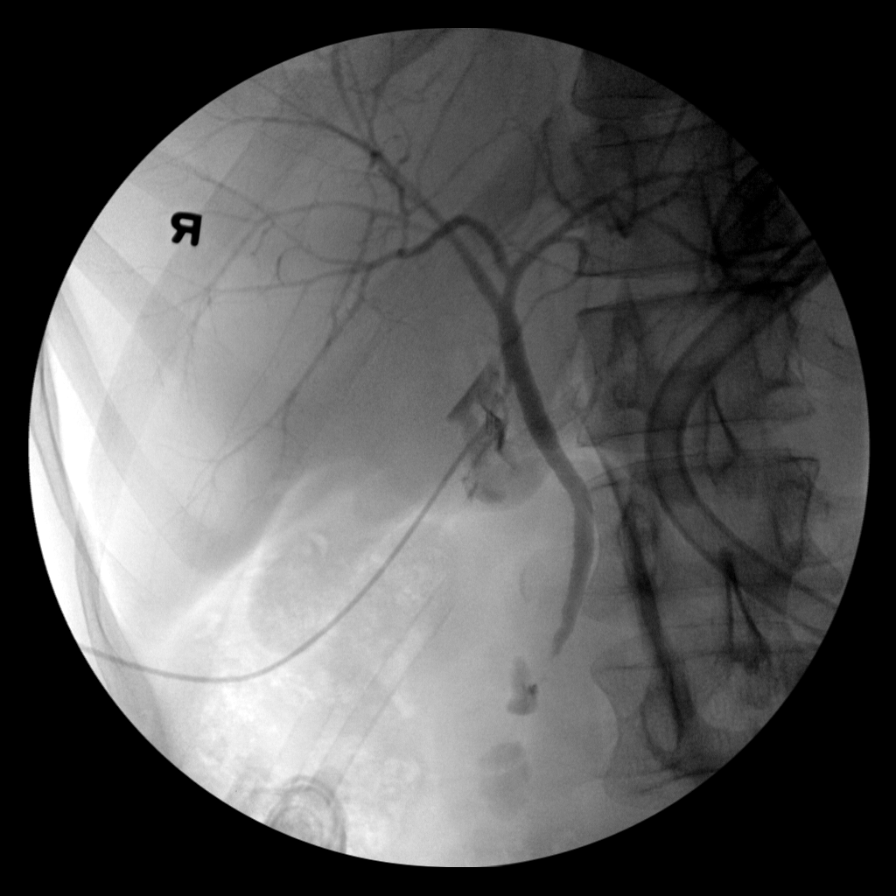
[im 3/3]
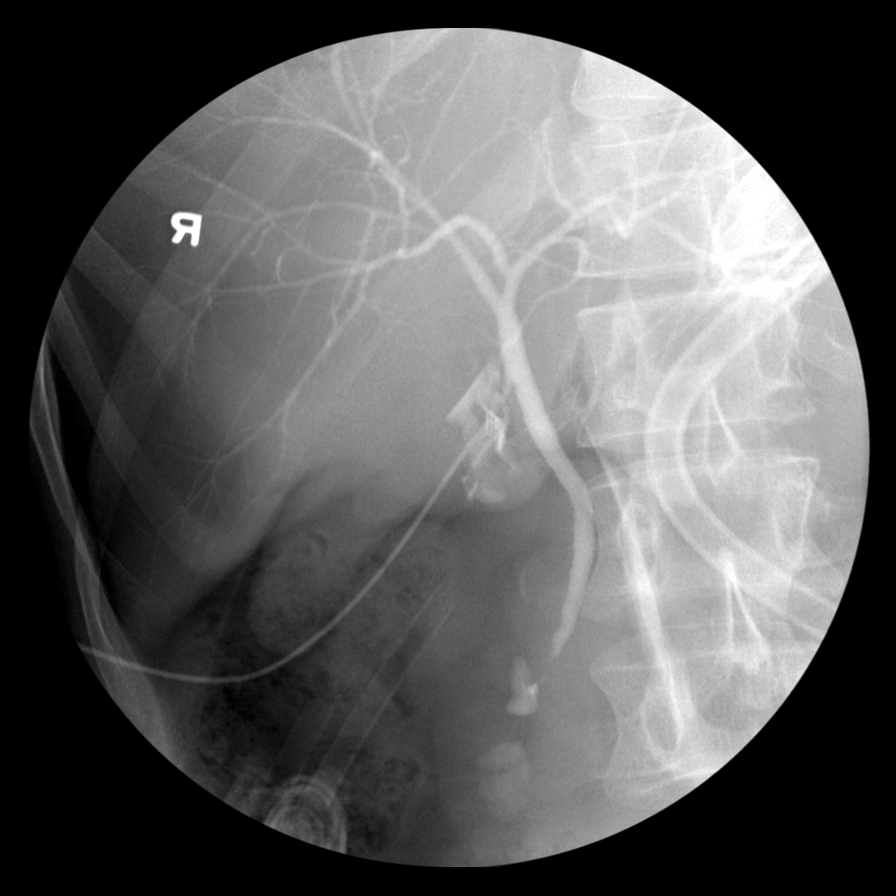

[6 of 6 positions shown; findings below may reference images not displayed]

FINDINGS: Intraoperative cholangiogram performed with a C-arm demonstrates a
normal opacified biliary tree without evidence of filling defect or
obstruction. Contrast enters the duodenum. There was a small amount
of contrast extravasation at the catheter entry site into the cystic
duct.
IMPRESSION: Unremarkable intraoperative cholangiogram.

## 2017-01-01 DIAGNOSIS — Z0000 Encounter for general adult medical examination without abnormal findings: Secondary | ICD-10-CM | POA: Diagnosis not present

## 2017-01-01 DIAGNOSIS — Z13220 Encounter for screening for lipoid disorders: Secondary | ICD-10-CM | POA: Diagnosis not present

## 2017-01-01 DIAGNOSIS — Z131 Encounter for screening for diabetes mellitus: Secondary | ICD-10-CM | POA: Diagnosis not present

## 2017-01-11 DIAGNOSIS — Z0000 Encounter for general adult medical examination without abnormal findings: Secondary | ICD-10-CM | POA: Diagnosis not present

## 2017-01-24 DIAGNOSIS — R140 Abdominal distension (gaseous): Secondary | ICD-10-CM | POA: Diagnosis not present

## 2017-01-24 DIAGNOSIS — K219 Gastro-esophageal reflux disease without esophagitis: Secondary | ICD-10-CM | POA: Diagnosis not present

## 2017-01-24 DIAGNOSIS — K921 Melena: Secondary | ICD-10-CM | POA: Diagnosis not present

## 2017-01-26 DIAGNOSIS — K219 Gastro-esophageal reflux disease without esophagitis: Secondary | ICD-10-CM | POA: Diagnosis not present

## 2017-01-26 DIAGNOSIS — K921 Melena: Secondary | ICD-10-CM | POA: Diagnosis not present

## 2017-01-26 DIAGNOSIS — R140 Abdominal distension (gaseous): Secondary | ICD-10-CM | POA: Diagnosis not present

## 2017-02-08 DIAGNOSIS — K219 Gastro-esophageal reflux disease without esophagitis: Secondary | ICD-10-CM | POA: Diagnosis not present

## 2017-02-08 DIAGNOSIS — K921 Melena: Secondary | ICD-10-CM | POA: Diagnosis not present

## 2017-02-08 DIAGNOSIS — D122 Benign neoplasm of ascending colon: Secondary | ICD-10-CM | POA: Diagnosis not present

## 2017-02-08 DIAGNOSIS — Z800 Family history of malignant neoplasm of digestive organs: Secondary | ICD-10-CM | POA: Diagnosis not present

## 2017-03-28 DIAGNOSIS — R195 Other fecal abnormalities: Secondary | ICD-10-CM | POA: Diagnosis not present

## 2017-03-28 DIAGNOSIS — K5900 Constipation, unspecified: Secondary | ICD-10-CM | POA: Diagnosis not present

## 2017-03-28 DIAGNOSIS — R140 Abdominal distension (gaseous): Secondary | ICD-10-CM | POA: Diagnosis not present

## 2017-04-20 DIAGNOSIS — Z9049 Acquired absence of other specified parts of digestive tract: Secondary | ICD-10-CM | POA: Diagnosis not present

## 2017-04-20 DIAGNOSIS — R140 Abdominal distension (gaseous): Secondary | ICD-10-CM | POA: Diagnosis not present

## 2017-04-20 DIAGNOSIS — R197 Diarrhea, unspecified: Secondary | ICD-10-CM | POA: Diagnosis not present

## 2017-08-14 DIAGNOSIS — R140 Abdominal distension (gaseous): Secondary | ICD-10-CM | POA: Diagnosis not present

## 2017-08-14 DIAGNOSIS — K648 Other hemorrhoids: Secondary | ICD-10-CM | POA: Diagnosis not present

## 2017-09-12 DIAGNOSIS — K648 Other hemorrhoids: Secondary | ICD-10-CM | POA: Diagnosis not present

## 2017-09-12 DIAGNOSIS — R140 Abdominal distension (gaseous): Secondary | ICD-10-CM | POA: Diagnosis not present

## 2017-10-10 DIAGNOSIS — L853 Xerosis cutis: Secondary | ICD-10-CM | POA: Diagnosis not present

## 2018-01-11 DIAGNOSIS — Z0000 Encounter for general adult medical examination without abnormal findings: Secondary | ICD-10-CM | POA: Diagnosis not present

## 2018-01-11 DIAGNOSIS — Z13220 Encounter for screening for lipoid disorders: Secondary | ICD-10-CM | POA: Diagnosis not present

## 2018-01-16 DIAGNOSIS — K648 Other hemorrhoids: Secondary | ICD-10-CM | POA: Diagnosis not present

## 2018-01-16 DIAGNOSIS — R140 Abdominal distension (gaseous): Secondary | ICD-10-CM | POA: Diagnosis not present

## 2018-07-24 DIAGNOSIS — S60561A Insect bite (nonvenomous) of right hand, initial encounter: Secondary | ICD-10-CM | POA: Diagnosis not present

## 2018-08-07 DIAGNOSIS — R151 Fecal smearing: Secondary | ICD-10-CM | POA: Diagnosis not present

## 2018-08-07 DIAGNOSIS — R140 Abdominal distension (gaseous): Secondary | ICD-10-CM | POA: Diagnosis not present

## 2018-08-07 DIAGNOSIS — K648 Other hemorrhoids: Secondary | ICD-10-CM | POA: Diagnosis not present

## 2018-09-04 DIAGNOSIS — Z23 Encounter for immunization: Secondary | ICD-10-CM | POA: Diagnosis not present

## 2018-10-02 DIAGNOSIS — R151 Fecal smearing: Secondary | ICD-10-CM | POA: Diagnosis not present

## 2018-10-02 DIAGNOSIS — K648 Other hemorrhoids: Secondary | ICD-10-CM | POA: Diagnosis not present

## 2018-10-02 DIAGNOSIS — R140 Abdominal distension (gaseous): Secondary | ICD-10-CM | POA: Diagnosis not present

## 2018-11-28 DIAGNOSIS — K648 Other hemorrhoids: Secondary | ICD-10-CM | POA: Diagnosis not present

## 2018-12-06 DIAGNOSIS — K644 Residual hemorrhoidal skin tags: Secondary | ICD-10-CM | POA: Diagnosis not present

## 2018-12-06 DIAGNOSIS — K648 Other hemorrhoids: Secondary | ICD-10-CM | POA: Diagnosis not present

## 2018-12-06 DIAGNOSIS — I10 Essential (primary) hypertension: Secondary | ICD-10-CM | POA: Diagnosis not present

## 2019-01-28 DIAGNOSIS — Z0000 Encounter for general adult medical examination without abnormal findings: Secondary | ICD-10-CM | POA: Diagnosis not present

## 2019-01-28 DIAGNOSIS — Z13220 Encounter for screening for lipoid disorders: Secondary | ICD-10-CM | POA: Diagnosis not present

## 2019-01-28 DIAGNOSIS — E538 Deficiency of other specified B group vitamins: Secondary | ICD-10-CM | POA: Diagnosis not present
